# Patient Record
Sex: Female | Born: 1975 | Race: White | Hispanic: No | Marital: Married | Smoking: Former smoker
Health system: Southern US, Community
[De-identification: ages and names within clinical notes are randomized; demographics above are authoritative.]

## PROBLEM LIST (undated history)

## (undated) DIAGNOSIS — F32A Depression, unspecified: Secondary | ICD-10-CM

## (undated) DIAGNOSIS — I10 Essential (primary) hypertension: Secondary | ICD-10-CM

## (undated) DIAGNOSIS — K219 Gastro-esophageal reflux disease without esophagitis: Secondary | ICD-10-CM

## (undated) DIAGNOSIS — R519 Headache, unspecified: Secondary | ICD-10-CM

## (undated) DIAGNOSIS — E785 Hyperlipidemia, unspecified: Secondary | ICD-10-CM

## (undated) HISTORY — DX: Headache, unspecified: R51.9

## (undated) HISTORY — PX: ABDOMINAL HYSTERECTOMY: SHX81

## (undated) HISTORY — DX: Essential (primary) hypertension: I10

## (undated) HISTORY — PX: OTHER SURGICAL HISTORY: SHX169

## (undated) HISTORY — DX: Depression, unspecified: F32.A

## (undated) HISTORY — PX: APPENDECTOMY: SHX54

## (undated) HISTORY — DX: Hyperlipidemia, unspecified: E78.5

## (undated) HISTORY — PX: COLONOSCOPY: SHX174

## (undated) HISTORY — DX: Gastro-esophageal reflux disease without esophagitis: K21.9

---

## 2016-11-21 LAB — HM PAP SMEAR

## 2019-11-09 LAB — HM MAMMOGRAPHY: HM Mammogram: NORMAL (ref 0–4)

## 2019-11-09 LAB — HM PAP SMEAR

## 2020-03-28 ENCOUNTER — Other Ambulatory Visit: Payer: Self-pay

## 2020-03-28 ENCOUNTER — Ambulatory Visit: Payer: 59 | Admitting: Internal Medicine

## 2020-03-28 ENCOUNTER — Encounter: Payer: Self-pay | Admitting: Internal Medicine

## 2020-03-28 VITALS — BP 128/86 | HR 63 | Temp 98.2°F | Ht 62.0 in | Wt 126.0 lb

## 2020-03-28 DIAGNOSIS — T502X5A Adverse effect of carbonic-anhydrase inhibitors, benzothiadiazides and other diuretics, initial encounter: Secondary | ICD-10-CM | POA: Diagnosis not present

## 2020-03-28 DIAGNOSIS — Z23 Encounter for immunization: Secondary | ICD-10-CM

## 2020-03-28 DIAGNOSIS — E782 Mixed hyperlipidemia: Secondary | ICD-10-CM | POA: Insufficient documentation

## 2020-03-28 DIAGNOSIS — K625 Hemorrhage of anus and rectum: Secondary | ICD-10-CM | POA: Diagnosis not present

## 2020-03-28 DIAGNOSIS — I10 Essential (primary) hypertension: Secondary | ICD-10-CM | POA: Insufficient documentation

## 2020-03-28 DIAGNOSIS — Z0001 Encounter for general adult medical examination with abnormal findings: Secondary | ICD-10-CM | POA: Diagnosis not present

## 2020-03-28 DIAGNOSIS — E876 Hypokalemia: Secondary | ICD-10-CM

## 2020-03-28 DIAGNOSIS — Z1159 Encounter for screening for other viral diseases: Secondary | ICD-10-CM | POA: Insufficient documentation

## 2020-03-28 DIAGNOSIS — E785 Hyperlipidemia, unspecified: Secondary | ICD-10-CM | POA: Insufficient documentation

## 2020-03-28 DIAGNOSIS — Z0000 Encounter for general adult medical examination without abnormal findings: Secondary | ICD-10-CM | POA: Insufficient documentation

## 2020-03-28 DIAGNOSIS — K581 Irritable bowel syndrome with constipation: Secondary | ICD-10-CM | POA: Insufficient documentation

## 2020-03-28 LAB — CBC WITH DIFFERENTIAL/PLATELET
Basophils Absolute: 0.1 10*3/uL (ref 0.0–0.1)
Basophils Relative: 2.6 % (ref 0.0–3.0)
Eosinophils Absolute: 0.1 10*3/uL (ref 0.0–0.7)
Eosinophils Relative: 1.8 % (ref 0.0–5.0)
HCT: 42.1 % (ref 36.0–46.0)
Hemoglobin: 14.9 g/dL (ref 12.0–15.0)
Lymphocytes Relative: 36.6 % (ref 12.0–46.0)
Lymphs Abs: 1.9 10*3/uL (ref 0.7–4.0)
MCHC: 35.4 g/dL (ref 30.0–36.0)
MCV: 90.5 fl (ref 78.0–100.0)
Monocytes Absolute: 0.4 10*3/uL (ref 0.1–1.0)
Monocytes Relative: 6.9 % (ref 3.0–12.0)
Neutro Abs: 2.7 10*3/uL (ref 1.4–7.7)
Neutrophils Relative %: 52.1 % (ref 43.0–77.0)
Platelets: 240 10*3/uL (ref 150.0–400.0)
RBC: 4.65 Mil/uL (ref 3.87–5.11)
RDW: 12 % (ref 11.5–15.5)
WBC: 5.1 10*3/uL (ref 4.0–10.5)

## 2020-03-28 LAB — LIPID PANEL
Cholesterol: 207 mg/dL — ABNORMAL HIGH (ref 0–200)
HDL: 91.7 mg/dL (ref >39.00)
LDL Cholesterol: 98 mg/dL (ref 0–99)
NonHDL: 115.05
Total CHOL/HDL Ratio: 2
Triglycerides: 85 mg/dL (ref 0.0–149.0)
VLDL: 17 mg/dL (ref 0.0–40.0)

## 2020-03-28 LAB — HEPATIC FUNCTION PANEL
ALT: 11 U/L (ref 0–35)
AST: 17 U/L (ref 0–37)
Albumin: 4.7 g/dL (ref 3.5–5.2)
Alkaline Phosphatase: 37 U/L — ABNORMAL LOW (ref 39–117)
Bilirubin, Direct: 0.1 mg/dL (ref 0.0–0.3)
Total Bilirubin: 0.6 mg/dL (ref 0.2–1.2)
Total Protein: 6.9 g/dL (ref 6.0–8.3)

## 2020-03-28 LAB — BASIC METABOLIC PANEL
BUN: 7 mg/dL (ref 6–23)
CO2: 31 mEq/L (ref 19–32)
Calcium: 9.7 mg/dL (ref 8.4–10.5)
Chloride: 96 mEq/L (ref 96–112)
Creatinine, Ser: 0.59 mg/dL (ref 0.40–1.20)
GFR: 109.41 mL/min (ref >60.00)
Glucose, Bld: 95 mg/dL (ref 70–99)
Potassium: 3.3 mEq/L — ABNORMAL LOW (ref 3.5–5.1)
Sodium: 136 mEq/L (ref 135–145)

## 2020-03-28 LAB — MAGNESIUM: Magnesium: 1.7 mg/dL (ref 1.5–2.5)

## 2020-03-28 LAB — TSH: TSH: 1.14 u[IU]/mL (ref 0.35–4.50)

## 2020-03-28 LAB — SEDIMENTATION RATE: Sed Rate: 1 mm/hr (ref 0–20)

## 2020-03-28 MED ORDER — POTASSIUM CHLORIDE CRYS ER 20 MEQ PO TBCR: 20.0000 meq | EXTENDED_RELEASE_TABLET | Freq: Two times a day (BID) | ORAL | 0 refills | Status: DC

## 2020-03-28 MED ORDER — TRULANCE 3 MG PO TABS: 1.0000 | ORAL_TABLET | Freq: Every day | ORAL | 0 refills | Status: DC

## 2020-03-28 NOTE — Patient Instructions (Signed)

## 2020-03-28 NOTE — Progress Notes (Signed)
Subjective:  Patient ID: Katrina Bartlett, female    DOB: 11/04/1975  Age: 44 y.o. MRN: [id number]  CC: Abdominal Pain, Hypertension, Hyperlipidemia, and Annual Exam  This visit occurred during the SARS-CoV-2 public health emergency.  Safety protocols were in place, including screening questions prior to the visit, additional usage of staff PPE, and extensive cleaning of exam room while observing appropriate contact time as indicated for disinfecting solutions.   NEW TO ME  HPI Katrina Bartlett presents for a CPX.  She has had chronic, intermittent abdominal pain most of her entire life.  Her pain was so bad 8 years ago that she had a colonoscopy that she says was normal.  She tells me the pain has worsened over the last 6 months.  She describes intermittent episodes of bloating and cramping.  She tells me the symptoms get better after she uses a laxative.  She also uses fiber for the constipation.  She is also noted a few episodes of blood in her stool.  She has a history of hypertension and is taking the combination of a thiazide diuretic and losartan.  She denies dizziness, lightheadedness, chest pain, shortness of breath, dyspnea on exertion, palpitations, edema, or fatigue.  History Katrina Bartlett has a past medical history of Depression, Generalized headaches, GERD (gastroesophageal reflux disease), Hyperlipidemia, and Hypertension.   She has a past surgical history that includes Appendectomy and Abdominal hysterectomy.   Her family history includes Arthritis in her maternal grandfather, maternal grandmother, paternal grandfather, and paternal grandmother; Cancer in her maternal grandmother and paternal grandfather; Early death in her brother; Hearing loss in her father; Heart disease in her maternal grandfather; Hyperlipidemia in her father and maternal grandfather; Hypertension in her father and maternal grandfather; Kidney disease in her maternal grandfather.She reports that she has quit  smoking. She has never used smokeless tobacco. She reports current alcohol use of about 5.0 standard drinks of alcohol per week. She reports that she does not use drugs.  Outpatient Medications Prior to Visit  Medication Sig Dispense Refill  . estradiol (ESTRACE) 2 MG tablet Take 2 mg by mouth daily.    Marland Kitchen. losartan (COZAAR) 25 MG tablet Take 25 mg by mouth at bedtime.    . pravastatin (PRAVACHOL) 20 MG tablet Take 20 mg by mouth daily.    . SUMAtriptan (IMITREX) 100 MG tablet Take 100 mg by mouth at bedtime.    . hydrochlorothiazide (HYDRODIURIL) 25 MG tablet Take 50 mg by mouth daily.     No facility-administered medications prior to visit.    ROS Review of Systems  Constitutional: Negative for appetite change, diaphoresis, fatigue and unexpected weight change.  HENT: Negative.   Respiratory: Negative for cough, chest tightness, shortness of breath and wheezing.   Cardiovascular: Negative for chest pain, palpitations and leg swelling.  Gastrointestinal: Positive for abdominal pain, anal bleeding, blood in stool and constipation. Negative for abdominal distention, diarrhea, nausea, rectal pain and vomiting.  Genitourinary: Negative.  Negative for difficulty urinating and dysuria.  Musculoskeletal: Negative.  Negative for arthralgias and myalgias.  Skin: Negative.   Neurological: Negative.  Negative for dizziness, weakness and light-headedness.  Hematological: Negative for adenopathy. Does not bruise/bleed easily.  Psychiatric/Behavioral: Negative.     Objective:  BP 128/86   Pulse 63   Temp 98.2 F (36.8 C) (Oral)   Ht 5\' 2"  (1.575 m)   Wt 126 lb (57.2 kg)   SpO2 98%   BMI 23.05 kg/m   Physical Exam Vitals reviewed. Exam conducted with  a chaperone present Occupational hygienist).  Constitutional:      Appearance: She is well-developed.  HENT:     Nose: Nose normal.     Mouth/Throat:     Mouth: Mucous membranes are moist.  Eyes:     General: No scleral icterus.    Conjunctiva/sclera:  Conjunctivae normal.  Cardiovascular:     Rate and Rhythm: Normal rate and regular rhythm.     Heart sounds: No murmur heard.   Pulmonary:     Effort: Pulmonary effort is normal.     Breath sounds: No stridor. No wheezing, rhonchi or rales.  Abdominal:     General: Abdomen is flat. Bowel sounds are normal. There is no distension.     Palpations: Abdomen is soft. There is no hepatomegaly, splenomegaly or mass.     Tenderness: There is no abdominal tenderness.     Hernia: No hernia is present.  Genitourinary:    Exam position: Prone.     Rectum: Guaiac result negative. Internal hemorrhoid present. No mass, tenderness, anal fissure or external hemorrhoid. Normal anal tone.  Musculoskeletal:        General: Normal range of motion.     Cervical back: Neck supple.     Right lower leg: No edema.     Left lower leg: No edema.  Lymphadenopathy:     Cervical: No cervical adenopathy.  Skin:    General: Skin is warm and dry.     Coloration: Skin is not pale.  Neurological:     General: No focal deficit present.     Mental Status: She is alert.  Psychiatric:        Mood and Affect: Mood normal.        Behavior: Behavior normal.     Lab Results  Component Value Date   WBC 5.1 03/28/2020   HGB 14.9 03/28/2020   HCT 42.1 03/28/2020   PLT 240.0 03/28/2020   GLUCOSE 95 03/28/2020   CHOL 207 (H) 03/28/2020   TRIG 85.0 03/28/2020   HDL 91.70 03/28/2020   LDLCALC 98 03/28/2020   ALT 11 03/28/2020   AST 17 03/28/2020   NA 136 03/28/2020   K 3.3 (L) 03/28/2020   CL 96 03/28/2020   CREATININE 0.59 03/28/2020   BUN 7 03/28/2020   CO2 31 03/28/2020   TSH 1.14 03/28/2020    Assessment & Plan:   Marthella was seen today for abdominal pain, hypertension, hyperlipidemia and annual exam.  Diagnoses and all orders for this visit:  Primary hypertension- Her blood pressure is adequately well controlled but she has developed hypokalemia.  I recommended she change from a thiazide diuretic  to a potassium sparing diuretic and to start taking a potassium supplement.  Will continue the current dose of the ARB.  She agrees to return in 1 to 2 months for monitoring of her potassium level and blood pressure. -     CBC with Differential/Platelet; Future -     Basic metabolic panel; Future -     Magnesium; Future -     TSH; Future -     TSH -     Magnesium -     Basic metabolic panel -     CBC with Differential/Platelet -     potassium chloride SA (KLOR-CON) 20 MEQ tablet; Take 1 tablet (20 mEq total) by mouth 2 (two) times daily. -     spironolactone (ALDACTONE) 25 MG tablet; Take 1 tablet (25 mg total) by mouth daily.  Hyperlipidemia with  target LDL less than 130- She has achieved her LDL goal and is doing well on the statin. -     TSH; Future -     Hepatic function panel; Future -     Hepatic function panel -     TSH  Routine general medical examination at a health care facility- Exam completed, labs reviewed, vaccines reviewed and updated, cancer screenings are UTD, patient education was given. -     Lipid panel; Future -     Hepatitis C antibody; Future -     HIV Antibody (routine testing w rflx); Future -     HIV Antibody (routine testing w rflx) -     Hepatitis C antibody -     Lipid panel  Irritable bowel syndrome with constipation.- Her labs are negative for secondary causes of constipation.  I recommend that she treat this with plecanatide tied. -     Basic metabolic panel; Future -     Magnesium; Future -     Plecanatide (TRULANCE) 3 MG TABS; Take 1 tablet by mouth daily. -     Magnesium -     Basic metabolic panel  BRBPR (bright red blood per rectum)- There is no blood in her stool today and she does not have upper GI symptoms.  Her CBC and sed rate are normal.  The bleeding is likely related to internal hemorrhoids but I recommended that she undergo a colonoscopy to be certain there is not a lesion in the colon to explain the bleeding. -     Sedimentation rate;  Future -     Ambulatory referral to Gastroenterology -     Sedimentation rate  Need for hepatitis C screening test -     Hepatitis C antibody; Future -     Hepatitis C antibody  Diuretic-induced hypokalemia -     potassium chloride SA (KLOR-CON) 20 MEQ tablet; Take 1 tablet (20 mEq total) by mouth 2 (two) times daily. -     spironolactone (ALDACTONE) 25 MG tablet; Take 1 tablet (25 mg total) by mouth daily.  Other orders -     Flu Vaccine QUAD 6+ mos PF IM (Fluarix Quad PF) -     Tdap vaccine greater than or equal to 7yo IM   I have discontinued Katrina Manis Swarm's hydrochlorothiazide. I am also having her start on Trulance, potassium chloride SA, and spironolactone. Additionally, I am having her maintain her estradiol, losartan, pravastatin, and SUMAtriptan.  Meds ordered this encounter  Medications  . Plecanatide (TRULANCE) 3 MG TABS    Sig: Take 1 tablet by mouth daily.    Dispense:  24 tablet    Refill:  0  . potassium chloride SA (KLOR-CON) 20 MEQ tablet    Sig: Take 1 tablet (20 mEq total) by mouth 2 (two) times daily.    Dispense:  180 tablet    Refill:  0  . spironolactone (ALDACTONE) 25 MG tablet    Sig: Take 1 tablet (25 mg total) by mouth daily.    Dispense:  90 tablet    Refill:  0   In addition to time spent on CPE, I spent 50 minutes in preparing to see the patient by review of recent labs, imaging and procedures, obtaining and reviewing separately obtained history, communicating with the patient and family or caregiver, ordering medications, tests or procedures, and documenting clinical information in the EHR including the differential Dx, treatment, and any further evaluation and other management of 1. Primary hypertension  2. Hyperlipidemia with target LDL less than 130 3. Irritable bowel syndrome with constipation 4. BRBPR (bright red blood per rectum) 5. Diuretic-induced hypokalemia     Follow-up: Return in about 3 months (around 06/28/2020).  Sanda Linger,  MD

## 2020-03-29 ENCOUNTER — Encounter: Payer: Self-pay | Admitting: Internal Medicine

## 2020-03-29 LAB — HEPATITIS C ANTIBODY
Hepatitis C Ab: NONREACTIVE
SIGNAL TO CUT-OFF: 0.01 (ref <1.00)

## 2020-03-29 LAB — HIV ANTIBODY (ROUTINE TESTING W REFLEX): HIV 1&2 Ab, 4th Generation: NONREACTIVE

## 2020-03-29 MED ORDER — SPIRONOLACTONE 25 MG PO TABS: 25.0000 mg | ORAL_TABLET | Freq: Every day | ORAL | 0 refills | Status: DC

## 2020-04-08 ENCOUNTER — Ambulatory Visit: Payer: 59 | Admitting: Gastroenterology

## 2020-04-08 ENCOUNTER — Encounter: Payer: Self-pay | Admitting: Gastroenterology

## 2020-04-08 DIAGNOSIS — K625 Hemorrhage of anus and rectum: Secondary | ICD-10-CM | POA: Diagnosis not present

## 2020-04-08 DIAGNOSIS — R140 Abdominal distension (gaseous): Secondary | ICD-10-CM

## 2020-04-08 MED ORDER — SUPREP BOWEL PREP KIT 17.5-3.13-1.6 GM/177ML PO SOLN: 1.0000 | ORAL | 0 refills | Status: DC

## 2020-04-08 NOTE — Patient Instructions (Signed)
If you are age 44 or older, your body mass index should be between 23-30. Your Body mass index is 25.42 kg/m. If this is out of the aforementioned range listed, please consider follow up with your Primary Care Provider.  If you are age 19 or younger, your body mass index should be between 19-25. Your Body mass index is 25.42 kg/m. If this is out of the aformentioned range listed, please consider follow up with your Primary Care Provider.   You have been scheduled for a colonoscopy. Please follow written instructions given to you at your visit today.  Please pick up your prep supplies at the pharmacy within the next 1-3 days. If you use inhalers (even only as needed), please bring them with you on the day of your procedure.  Due to recent changes in healthcare laws, you may see the results of your imaging and laboratory studies on MyChart before your provider has had a chance to review them.  We understand that in some cases there may be results that are confusing or concerning to you. Not all laboratory results come back in the same time frame and the provider may be waiting for multiple results in order to interpret others.  Please give [place] 48 hours in order for your provider to thoroughly review all the results before contacting the office for clarification of your results.   Please start taking citrucel (orange flavored) powder fiber supplement.  This may cause some bloating at first but that usually goes away. Begin with a small spoonful and work your way up to a large, heaping spoonful daily over a week.  Thank you for entrusting me with your care and choosing [facility].  Dr Christella Hartigan

## 2020-04-08 NOTE — Progress Notes (Signed)
HPI: This is a very pleasant 44 year old woman   who was referred to me by Etta Grandchild, MD  to evaluate minor rectal bleeding, chronic bloating.    She has had discomforts and troubles with her bowels for 10 to 15 years.  These have gradually been getting worse.  She describes incomplete evacuation, extreme bloating.  She will have to wear loose pants because of the bloating and the pains that her pants were because.  She had a colonoscopy 10 to 12 years ago in [place].  She was told she had scar tissue from when her appendix was removed and that surgery might help but also might make things worse.  She is not really constipated but does have a sensation of incomplete evacuation usually.  She has a lot of bloating and that seems to be her biggest complaint.  Her weight is overall stable.  She takes probiotics twice daily and this seems to help.  Only she takes it twice daily.  She tried fiber supplements a long time ago and was not very consistent with it.  She has tried aloe vera juice and that did seem to help but just minorly.  She has seen red blood in her stool twice in the past 2 or 3 months  Colon cancer does not run in her family but her mother does the bowel diverticulosis  Old Data Reviewed:  Blood work November 2021 normal CBC, normal complete metabolic profile, normal TSH   Review of systems: Pertinent positive and negative review of systems were noted in the above HPI section. All other review negative.   Past Medical History:  Diagnosis Date   Depression    Generalized headaches    GERD (gastroesophageal reflux disease)    Hyperlipidemia    Hypertension     Past Surgical History:  Procedure Laterality Date   ABDOMINAL HYSTERECTOMY     partial   APPENDECTOMY     COLONOSCOPY      Current Outpatient Medications  Medication Sig Dispense Refill   estradiol (ESTRACE) 2 MG tablet Take 2 mg by mouth daily.     losartan (COZAAR) 25 MG tablet Take 25  mg by mouth at bedtime.     potassium chloride SA (KLOR-CON) 20 MEQ tablet Take 1 tablet (20 mEq total) by mouth 2 (two) times daily. 180 tablet 0   pravastatin (PRAVACHOL) 20 MG tablet Take 20 mg by mouth daily.     spironolactone (ALDACTONE) 25 MG tablet Take 1 tablet (25 mg total) by mouth daily. 90 tablet 0   SUMAtriptan (IMITREX) 100 MG tablet Take 100 mg by mouth at bedtime.     No current facility-administered medications for this visit.    Allergies as of 04/08/2020 - Review Complete 04/08/2020  Allergen Reaction Noted   Penicillins Hives 03/28/2020    Family History  Problem Relation Age of Onset   Hearing loss Father    Hyperlipidemia Father    Hypertension Father    Early death Brother        SUICIDE   Arthritis Maternal Grandmother    Cancer Maternal Grandmother    Arthritis Maternal Grandfather    Heart disease Maternal Grandfather    Hyperlipidemia Maternal Grandfather    Hypertension Maternal Grandfather    Kidney disease Maternal Grandfather    Arthritis Paternal Grandmother    Arthritis Paternal Grandfather    Cancer Paternal Grandfather    Colon cancer Neg Hx    Pancreatic cancer Neg Hx  Esophageal cancer Neg Hx     Social History   Socioeconomic History   Marital status: Married    Spouse name: Not on file   Number of children: Not on file   Years of education: Not on file   Highest education level: Not on file  Occupational History   Not on file  Tobacco Use   Smoking status: Former Smoker   Smokeless tobacco: Never Used  Vaping Use   Vaping Use: Never used  Substance and Sexual Activity   Alcohol use: Yes    Alcohol/week: 5.0 standard drinks    Types: 5 Glasses of wine per week   Drug use: Never   Sexual activity: Yes    Partners: Male    Birth control/protection: Surgical  Other Topics Concern   Not on file  Social History Narrative   Not on file   Social Determinants of Health   Financial  Resource Strain:    Difficulty of Paying Living Expenses: Not on file  Food Insecurity:    Worried About Programme researcher, broadcasting/film/video in the Last Year: Not on file   [employer] of Food in the Last Year: Not on file  Transportation Needs:    Lack of Transportation (Medical): Not on file   Lack of Transportation (Non-Medical): Not on file  Physical Activity:    Days of Exercise per Week: Not on file   Minutes of Exercise per Session: Not on file  Stress:    Feeling of Stress : Not on file  Social Connections:    Frequency of Communication with Friends and Family: Not on file   Frequency of Social Gatherings with Friends and Family: Not on file   Attends Religious Services: Not on file   Active Member of Clubs or Organizations: Not on file   Attends Banker Meetings: Not on file   Marital Status: Not on file  Intimate Partner Violence:    Fear of Current or Ex-Partner: Not on file   Emotionally Abused: Not on file   Physically Abused: Not on file   Sexually Abused: Not on file     Physical Exam: Ht 5\' 2"  (1.575 m)    Wt 139 lb (63 kg)    BMI 25.42 kg/m  Constitutional: generally well-appearing Psychiatric: alert and oriented x3 Eyes: extraocular movements intact Mouth: oral pharynx moist, no lesions Neck: supple no lymphadenopathy Cardiovascular: heart regular rate and rhythm Lungs: clear to auscultation bilaterally Abdomen: soft, nontender, nondistended, no obvious ascites, no peritoneal signs, normal bowel sounds Extremities: no lower extremity edema bilaterally Skin: no lesions on visible extremities   Assessment and plan: 44 y.o. female with chronic bloating, chronic incomplete evacuation  Suspicious for functional disease, perhaps irritable bowel syndrome.  She has seen some blood in her stool recently recommended colonoscopy.  I would also like to look at the area of her appendix to get an idea of what she was told previously about scar tissue.   That might have been a discussion about adhesive disease but perhaps she had a complex appendectomy which required ileocolonic anastomosis and she has stricturing at that site.  She does not recall ever being told that she had severe necrotic type appendectomy however.  In the meantime she is going to slowly titrate upward on Citrucel powder fiber supplement.  She understands that at first this might make her bloating worse but over time will hopefully help her sensation of incomplete evacuation and then maybe hopefully also it improve her  bloating.  Please see the "Patient Instructions" section for addition details about the plan.   Rob Bunting, MD [facility] 04/08/2020, 1:29 PM  Cc: Etta Grandchild, MD  Total time on date of encounter was (this included time spent preparing to see the patient reviewing records; obtaining and/or reviewing separately obtained history; performing a medically appropriate exam and/or evaluation; counseling and educating the patient and family if present; ordering medications, tests or procedures if applicable; and documenting clinical information in the health record).

## 2020-04-16 ENCOUNTER — Encounter: Payer: Self-pay | Admitting: Internal Medicine

## 2020-04-18 ENCOUNTER — Other Ambulatory Visit: Payer: Self-pay | Admitting: Internal Medicine

## 2020-04-18 DIAGNOSIS — I10 Essential (primary) hypertension: Secondary | ICD-10-CM

## 2020-04-18 DIAGNOSIS — T502X5A Adverse effect of carbonic-anhydrase inhibitors, benzothiadiazides and other diuretics, initial encounter: Secondary | ICD-10-CM

## 2020-04-18 MED ORDER — LOSARTAN POTASSIUM 100 MG PO TABS: 100.0000 mg | ORAL_TABLET | Freq: Every day | ORAL | 0 refills | Status: DC

## 2020-04-18 MED ORDER — SPIRONOLACTONE 50 MG PO TABS: 50.0000 mg | ORAL_TABLET | Freq: Every day | ORAL | 0 refills | Status: DC

## 2020-04-25 ENCOUNTER — Telehealth: Payer: Self-pay | Admitting: Internal Medicine

## 2020-04-25 NOTE — Telephone Encounter (Signed)
Spoke to pt, informed her per 11/8 lab result note PCP wanted her to return in about 2-50mo for a recheck. She has an OV schedule for February for a 85mo follow up and she will have it checked at that time.

## 2020-04-25 NOTE — Telephone Encounter (Signed)
    Patient wants to know if she needs to have potasium checked next week  Please advise/order

## 2020-06-02 ENCOUNTER — Encounter: Payer: Self-pay | Admitting: Internal Medicine

## 2020-06-02 ENCOUNTER — Other Ambulatory Visit: Payer: Self-pay

## 2020-06-02 ENCOUNTER — Ambulatory Visit: Payer: 59 | Admitting: Internal Medicine

## 2020-06-02 VITALS — BP 144/92 | HR 67 | Temp 97.9°F | Resp 16 | Ht 62.0 in | Wt 128.0 lb

## 2020-06-02 DIAGNOSIS — E876 Hypokalemia: Secondary | ICD-10-CM

## 2020-06-02 DIAGNOSIS — G43009 Migraine without aura, not intractable, without status migrainosus: Secondary | ICD-10-CM | POA: Diagnosis not present

## 2020-06-02 DIAGNOSIS — T502X5A Adverse effect of carbonic-anhydrase inhibitors, benzothiadiazides and other diuretics, initial encounter: Secondary | ICD-10-CM | POA: Diagnosis not present

## 2020-06-02 DIAGNOSIS — I10 Essential (primary) hypertension: Secondary | ICD-10-CM

## 2020-06-02 LAB — BASIC METABOLIC PANEL
BUN: 9 mg/dL (ref 6–23)
CO2: 30 mEq/L (ref 19–32)
Calcium: 9.7 mg/dL (ref 8.4–10.5)
Chloride: 101 mEq/L (ref 96–112)
Creatinine, Ser: 0.63 mg/dL (ref 0.40–1.20)
GFR: 107.56 mL/min (ref >60.00)
Glucose, Bld: 87 mg/dL (ref 70–99)
Potassium: 3.7 mEq/L (ref 3.5–5.1)
Sodium: 136 mEq/L (ref 135–145)

## 2020-06-02 LAB — MAGNESIUM: Magnesium: 1.6 mg/dL (ref 1.5–2.5)

## 2020-06-02 MED ORDER — NURTEC 75 MG PO TBDP: 1.0000 | ORAL_TABLET | Freq: Every day | ORAL | 0 refills | Status: DC | PRN

## 2020-06-02 MED ORDER — TORSEMIDE 10 MG PO TABS: 10.0000 mg | ORAL_TABLET | Freq: Every day | ORAL | 1 refills | Status: DC

## 2020-06-02 NOTE — Patient Instructions (Signed)

## 2020-06-02 NOTE — Progress Notes (Signed)
Subjective:  Patient ID: Katrina Bartlett, female    DOB: 06-Jul-1975  Age: 45 y.o. MRN: [id number]  CC: Hypertension  This visit occurred during the SARS-CoV-2 public health emergency.  Safety protocols were in place, including screening questions prior to the visit, additional usage of staff PPE, and extensive cleaning of exam room while observing appropriate contact time as indicated for disinfecting solutions.    HPI Katrina Bartlett presents for f/up - She complains that she intermittently has spikes in her blood pressure and swelling in her hands and feet.  She occasionally takes a triptan for headaches.  She denies chest pain, shortness of breath, palpitations, or muscle aches.  Outpatient Medications Prior to Visit  Medication Sig Dispense Refill  . estradiol (ESTRACE) 2 MG tablet Take 2 mg by mouth daily.    Marland Kitchen losartan (COZAAR) 100 MG tablet Take 1 tablet (100 mg total) by mouth daily. 90 tablet 0  . Na Sulfate-K Sulfate-Mg Sulf (SUPREP BOWEL PREP KIT) 17.5-3.13-1.6 GM/177ML SOLN Take 1 kit by mouth as directed. 324 mL 0  . potassium chloride SA (KLOR-CON) 20 MEQ tablet Take 1 tablet (20 mEq total) by mouth 2 (two) times daily. 180 tablet 0  . pravastatin (PRAVACHOL) 20 MG tablet Take 20 mg by mouth daily.    Marland Kitchen spironolactone (ALDACTONE) 50 MG tablet Take 1 tablet (50 mg total) by mouth daily. 90 tablet 0  . SUMAtriptan (IMITREX) 100 MG tablet Take 100 mg by mouth at bedtime.     No facility-administered medications prior to visit.    ROS Review of Systems  Constitutional: Negative for appetite change, diaphoresis and fatigue.  Eyes: Negative for visual disturbance.  Respiratory: Negative for cough, chest tightness, shortness of breath and wheezing.   Cardiovascular: Negative for chest pain, palpitations and leg swelling.  Gastrointestinal: Negative for abdominal pain, constipation, diarrhea, nausea and vomiting.  Endocrine: Negative.   Genitourinary: Negative.  Negative for  difficulty urinating.  Musculoskeletal: Negative.  Negative for myalgias.  Skin: Negative.  Negative for color change, pallor and rash.  Neurological: Positive for headaches. Negative for dizziness, weakness and light-headedness.  Hematological: Negative for adenopathy. Does not bruise/bleed easily.  Psychiatric/Behavioral: Negative.     Objective:  BP (!) 144/92   Pulse 67   Temp 97.9 F (36.6 C) (Oral)   Resp 16   Ht '5\' 2"'  (1.575 m)   Wt 128 lb (58.1 kg)   SpO2 97%   BMI 23.41 kg/m   BP Readings from Last 3 Encounters:  06/02/20 (!) 144/92  04/08/20 128/80  03/28/20 128/86    Wt Readings from Last 3 Encounters:  06/02/20 128 lb (58.1 kg)  04/08/20 139 lb (63 kg)  03/28/20 126 lb (57.2 kg)    Physical Exam Vitals reviewed.  Constitutional:      Appearance: Normal appearance.  HENT:     Nose: Nose normal.     Mouth/Throat:     Mouth: Mucous membranes are moist.  Eyes:     General: No scleral icterus.    Extraocular Movements: Extraocular movements intact.     Conjunctiva/sclera: Conjunctivae normal.     Pupils: Pupils are equal, round, and reactive to light.  Cardiovascular:     Rate and Rhythm: Normal rate and regular rhythm.     Heart sounds: No murmur heard.   Pulmonary:     Effort: Pulmonary effort is normal.     Breath sounds: No stridor. No wheezing, rhonchi or rales.  Abdominal:     General:  Abdomen is flat.     Palpations: There is no mass.     Tenderness: There is no abdominal tenderness. There is no guarding.  Musculoskeletal:        General: Normal range of motion.     Cervical back: Neck supple.     Right lower leg: No edema.     Left lower leg: No edema.  Lymphadenopathy:     Cervical: No cervical adenopathy.  Skin:    General: Skin is warm and dry.     Coloration: Skin is not pale.  Neurological:     General: No focal deficit present.     Mental Status: She is alert.     Lab Results  Component Value Date   WBC 5.1 03/28/2020    HGB 14.9 03/28/2020   HCT 42.1 03/28/2020   PLT 240.0 03/28/2020   GLUCOSE 95 03/28/2020   CHOL 207 (H) 03/28/2020   TRIG 85.0 03/28/2020   HDL 91.70 03/28/2020   LDLCALC 98 03/28/2020   ALT 11 03/28/2020   AST 17 03/28/2020   NA 136 03/28/2020   K 3.3 (L) 03/28/2020   CL 96 03/28/2020   CREATININE 0.59 03/28/2020   BUN 7 03/28/2020   CO2 31 03/28/2020   TSH 1.14 03/28/2020    Patient was never admitted.  Assessment & Plan:   Katrina Bartlett was seen today for hypertension.  Diagnoses and all orders for this visit:  Diuretic-induced hypokalemia- Her potassium level is normal now.  Will continue the current dose of spironolactone and the potassium supplement. -     Basic metabolic panel; Future -     Cancel: Magnesium; Future -     Magnesium; Future -     Magnesium -     Basic metabolic panel  Primary hypertension- Her blood pressure is not adequately well controlled and she intermittently complains of peripheral edema.  Will start a loop diuretic. -     Basic metabolic panel; Future -     Cancel: Magnesium; Future -     Magnesium; Future -     Magnesium -     Basic metabolic panel -     torsemide (DEMADEX) 10 MG tablet; Take 1 tablet (10 mg total) by mouth daily.  Migraine without aura and without status migrainosus, not intractable- I am concerned the triptan may be elevating her blood pressure so Irecommended that she switch to a CGRP antagonist. -     Rimegepant Sulfate (NURTEC) 75 MG TBDP; Take 1 tablet by mouth daily as needed.   I am having Katrina Bartlett start on Nurtec and torsemide. I am also having her maintain her estradiol, pravastatin, SUMAtriptan, potassium chloride SA, Suprep Bowel Prep Kit, losartan, and spironolactone.  Meds ordered this encounter  Medications  . Rimegepant Sulfate (NURTEC) 75 MG TBDP    Sig: Take 1 tablet by mouth daily as needed.    Dispense:  8 tablet    Refill:  0  . torsemide (DEMADEX) 10 MG tablet    Sig: Take 1 tablet (10 mg  total) by mouth daily.    Dispense:  90 tablet    Refill:  1     Follow-up: No follow-ups on file.  Katrina Calico, MD

## 2020-06-08 ENCOUNTER — Telehealth: Payer: Self-pay | Admitting: Gastroenterology

## 2020-06-08 ENCOUNTER — Encounter: Payer: Self-pay | Admitting: Internal Medicine

## 2020-06-08 NOTE — Telephone Encounter (Signed)
Good morning Dr. Christella Hartigan, patient called and stated husband contracted covid so she rescheduled her procedure from 06/13/2020 to 07/12/2020.

## 2020-06-09 MED ORDER — PRAVASTATIN SODIUM 20 MG PO TABS: 20.0000 mg | ORAL_TABLET | Freq: Every day | ORAL | 3 refills | Status: DC

## 2020-06-13 ENCOUNTER — Encounter: Payer: 59 | Admitting: Gastroenterology

## 2020-06-22 ENCOUNTER — Other Ambulatory Visit: Payer: Self-pay | Admitting: Internal Medicine

## 2020-06-22 DIAGNOSIS — T502X5A Adverse effect of carbonic-anhydrase inhibitors, benzothiadiazides and other diuretics, initial encounter: Secondary | ICD-10-CM

## 2020-06-22 DIAGNOSIS — I10 Essential (primary) hypertension: Secondary | ICD-10-CM

## 2020-06-22 DIAGNOSIS — E876 Hypokalemia: Secondary | ICD-10-CM

## 2020-06-22 MED ORDER — POTASSIUM CHLORIDE CRYS ER 20 MEQ PO TBCR
20.0000 meq | EXTENDED_RELEASE_TABLET | Freq: Two times a day (BID) | ORAL | 0 refills | Status: DC
Start: 2020-06-22 — End: 2020-09-22

## 2020-07-01 ENCOUNTER — Encounter: Payer: Self-pay | Admitting: Gastroenterology

## 2020-07-04 ENCOUNTER — Other Ambulatory Visit: Payer: Self-pay | Admitting: Internal Medicine

## 2020-07-04 DIAGNOSIS — G43009 Migraine without aura, not intractable, without status migrainosus: Secondary | ICD-10-CM

## 2020-07-05 ENCOUNTER — Other Ambulatory Visit: Payer: Self-pay | Admitting: Internal Medicine

## 2020-07-05 ENCOUNTER — Encounter: Payer: Self-pay | Admitting: Internal Medicine

## 2020-07-05 DIAGNOSIS — G43009 Migraine without aura, not intractable, without status migrainosus: Secondary | ICD-10-CM

## 2020-07-05 MED ORDER — NURTEC 75 MG PO TBDP: 1.0000 | ORAL_TABLET | Freq: Every day | ORAL | 0 refills | Status: DC | PRN

## 2020-07-06 ENCOUNTER — Encounter: Payer: Self-pay | Admitting: Internal Medicine

## 2020-07-06 DIAGNOSIS — G43009 Migraine without aura, not intractable, without status migrainosus: Secondary | ICD-10-CM

## 2020-07-06 MED ORDER — NURTEC 75 MG PO TBDP: 1.0000 | ORAL_TABLET | Freq: Every day | ORAL | 0 refills | Status: DC | PRN

## 2020-07-07 ENCOUNTER — Encounter: Payer: Self-pay | Admitting: Internal Medicine

## 2020-07-07 NOTE — Telephone Encounter (Signed)
Patient called and said that [facility] did not get the prescription. She said that their system has been down the last few times she has been there and was wondering if it could be called in.    Rimegepant Sulfate (NURTEC) 75 MG TBDP  [id number] - [place], [place] - [address] AT [facility] [address]

## 2020-07-11 ENCOUNTER — Encounter: Payer: Self-pay | Admitting: Internal Medicine

## 2020-07-12 ENCOUNTER — Encounter: Payer: Self-pay | Admitting: Gastroenterology

## 2020-07-12 ENCOUNTER — Ambulatory Visit (AMBULATORY_SURGERY_CENTER): Payer: 59 | Admitting: Gastroenterology

## 2020-07-12 ENCOUNTER — Other Ambulatory Visit: Payer: Self-pay

## 2020-07-12 VITALS — BP 123/63 | HR 55 | Temp 97.3°F | Resp 15 | Ht 62.0 in | Wt 139.0 lb

## 2020-07-12 DIAGNOSIS — R194 Change in bowel habit: Secondary | ICD-10-CM

## 2020-07-12 DIAGNOSIS — K625 Hemorrhage of anus and rectum: Secondary | ICD-10-CM | POA: Diagnosis present

## 2020-07-12 MED ORDER — SODIUM CHLORIDE 0.9 % IV SOLN
500.0000 mL | Freq: Once | INTRAVENOUS | Status: DC
Start: 2020-07-12 — End: 2020-07-12

## 2020-07-12 NOTE — Progress Notes (Signed)
Vs by CW in adm 

## 2020-07-12 NOTE — Progress Notes (Signed)
Called to room to assist during endoscopic procedure.  Patient ID and intended procedure confirmed with present staff. Received instructions for my participation in the procedure from the performing physician.  

## 2020-07-12 NOTE — Patient Instructions (Signed)
YOU HAD AN ENDOSCOPIC PROCEDURE TODAY AT THE [facility]:   Refer to the procedure report that was given to you for any specific questions about what was found during the examination.  If the procedure report does not answer your questions, please call your gastroenterologist to clarify.  If you requested that your care partner not be given the details of your procedure findings, then the procedure report has been included in a sealed envelope for you to review at your convenience later.  YOU SHOULD EXPECT: Some feelings of bloating in the abdomen. Passage of more gas than usual.  Walking can help get rid of the air that was put into your GI tract during the procedure and reduce the bloating. If you had a lower endoscopy (such as a colonoscopy or flexible sigmoidoscopy) you may notice spotting of blood in your stool or on the toilet paper. If you underwent a bowel prep for your procedure, you may not have a normal bowel movement for a few days.  Please Note:  You might notice some irritation and congestion in your nose or some drainage.  This is from the oxygen used during your procedure.  There is no need for concern and it should clear up in a day or so.  SYMPTOMS TO REPORT IMMEDIATELY:   Following lower endoscopy (colonoscopy or flexible sigmoidoscopy):  Excessive amounts of blood in the stool  Significant tenderness or worsening of abdominal pains  Swelling of the abdomen that is new, acute  Fever of 100F or higher  For urgent or emergent issues, a gastroenterologist can be reached at any hour by calling [phone]. Do not use MyChart messaging for urgent concerns.    DIET:  We do recommend a small meal at first, but then you may proceed to your regular diet.  Drink plenty of fluids but you should avoid alcoholic beverages for 24 hours.  ACTIVITY:  You should plan to take it easy for the rest of today and you should NOT DRIVE or use heavy machinery until tomorrow (because  of the sedation medicines used during the test).    FOLLOW UP: Our staff will call the number listed on your records 48-72 hours following your procedure to check on you and address any questions or concerns that you may have regarding the information given to you following your procedure. If we do not reach you, we will leave a message.  We will attempt to reach you two times.  During this call, we will ask if you have developed any symptoms of COVID 19. If you develop any symptoms (ie: fever, flu-like symptoms, shortness of breath, cough etc.) before then, please call [phone].  If you test positive for Covid 19 in the 2 weeks post procedure, please call and report this information to us.    If any biopsies were taken you will be contacted by phone or by letter within the next 1-3 weeks.  Please call us at [phone] if you have not heard about the biopsies in 3 weeks.    SIGNATURES/CONFIDENTIALITY: You and/or your care partner have signed paperwork which will be entered into your electronic medical record.  These signatures attest to the fact that that the information above on your After Visit Summary has been reviewed and is understood.  Full responsibility of the confidentiality of this discharge information lies with you and/or your care-partner. 

## 2020-07-12 NOTE — Op Note (Signed)
[facility] Patient Name: Katrina Bartlett Procedure Date: 07/12/2020 2:11 PM MRN: [id number] Endoscopist: Rachael Fee , MD Age: 45 Referring MD:  Date of Birth: 1976/03/21 Gender: Female Account #: [id number] Procedure:                Colonoscopy Indications:              Change in bowel habits, bloating, h/o remote                            appendectomy, minor rectal bleeding Medicines:                Monitored Anesthesia Care Procedure:                Pre-Anesthesia Assessment:                           - Prior to the procedure, a History and Physical                            was performed, and patient medications and                            allergies were reviewed. The patient's tolerance of                            previous anesthesia was also reviewed. The risks                            and benefits of the procedure and the sedation                            options and risks were discussed with the patient.                            All questions were answered, and informed consent                            was obtained. Prior Anticoagulants: The patient has                            taken no previous anticoagulant or antiplatelet                            agents. ASA Grade Assessment: II - A patient with                            mild systemic disease. After reviewing the risks                            and benefits, the patient was deemed in                            satisfactory condition to undergo the procedure.  After obtaining informed consent, the colonoscope                            was passed under direct vision. Throughout the                            procedure, the patient's blood pressure, pulse, and                            oxygen saturations were monitored continuously. The                            Olympus CF-[id number]) Colonoscope was                            introduced through the anus and  advanced to the the                            terminal ileum. The colonoscopy was performed                            without difficulty. The patient tolerated the                            procedure well. The quality of the bowel                            preparation was excellent. Scope In: 2:20:14 PM Scope Out: 2:31:49 PM Scope Withdrawal Time: 0 hours 6 minutes 29 seconds  Total Procedure Duration: 0 hours 11 minutes 35 seconds  Findings:                 The terminal ileum appeared normal.                           Biopsies for histology were taken with a cold                            forceps from the entire colon for evaluation of                            microscopic colitis.                           The exam was otherwise without abnormality on                            direct and retroflexion views. Complications:            No immediate complications. Estimated blood loss:                            None. Estimated Blood Loss:     Estimated blood loss was minimal. Impression:               - The examined portion of the ileum was normal.                           -  The examination was otherwise normal on direct                            and retroflexion views.                           - Biopsies were taken with a cold forceps from the                            entire colon for evaluation of microscopic colitis.                           - You previous minor rectal bleeding was almost                            certainly from small hemorrhoids that have since                            resolved. Recommendation:           - Patient has a contact number available for                            emergencies. The signs and symptoms of potential                            delayed complications were discussed with the                            patient. Return to normal activities tomorrow.                            Written discharge instructions were provided to the                             patient.                           - Resume previous diet.                           - Continue present medications.                           - Await pathology results. Rachael Fee, MD 07/12/2020 2:42:59 PM This report has been signed electronically.

## 2020-07-12 NOTE — Progress Notes (Signed)
Report to PACU, RN, vss, BBS= Clear.  

## 2020-07-13 ENCOUNTER — Other Ambulatory Visit: Payer: Self-pay | Admitting: Internal Medicine

## 2020-07-13 DIAGNOSIS — E876 Hypokalemia: Secondary | ICD-10-CM

## 2020-07-13 DIAGNOSIS — I10 Essential (primary) hypertension: Secondary | ICD-10-CM

## 2020-07-13 DIAGNOSIS — T502X5A Adverse effect of carbonic-anhydrase inhibitors, benzothiadiazides and other diuretics, initial encounter: Secondary | ICD-10-CM

## 2020-07-13 MED ORDER — LOSARTAN POTASSIUM 100 MG PO TABS: 100.0000 mg | ORAL_TABLET | Freq: Every day | ORAL | 2 refills | Status: DC

## 2020-07-13 NOTE — Telephone Encounter (Signed)
Key: [id number]

## 2020-07-13 NOTE — Telephone Encounter (Signed)
  [facility] denial [facility] calling with  additional clinical questions Will medication be used in combination [place]? Has patient seen a specialist?  Please call [phone] Ref #[id number]

## 2020-07-14 ENCOUNTER — Ambulatory Visit: Payer: 59 | Admitting: Internal Medicine

## 2020-07-14 ENCOUNTER — Telehealth: Payer: Self-pay | Admitting: *Deleted

## 2020-07-14 MED ORDER — SPIRONOLACTONE 50 MG PO TABS: 50.0000 mg | ORAL_TABLET | Freq: Every day | ORAL | 0 refills | Status: DC

## 2020-07-14 NOTE — Telephone Encounter (Signed)
  Follow up Call-  Call back number 07/12/2020  Post procedure Call Back phone  # [phone]  Permission to leave phone message Yes     Patient questions:  Do you have a fever, pain , or abdominal swelling? No. Pain Score  0 *  Have you tolerated food without any problems? Yes.    Have you been able to return to your normal activities? Yes.    Do you have any questions about your discharge instructions: Diet   No. Medications  No. Follow up visit  No.  Do you have questions or concerns about your Care? No.  Actions: * If pain score is 4 or above: 1. No action needed, pain <4.Have you developed a fever since your procedure? no  2.   Have you had an respiratory symptoms (SOB or cough) since your procedure? no  3.   Have you tested positive for COVID 19 since your procedure no  4.   Have you had any family members/close contacts diagnosed with the COVID 19 since your procedure?  no   If yes to any of these questions please route to Laverna Peace, RN and Karlton Lemon, RN

## 2020-07-15 NOTE — Telephone Encounter (Signed)
The correct number for [facility] is [phone]  I have sat in queue for without speaking to a rep.  Will attempt to call again on Monday morning.

## 2020-07-19 ENCOUNTER — Encounter: Payer: Self-pay | Admitting: Internal Medicine

## 2020-07-19 ENCOUNTER — Other Ambulatory Visit: Payer: Self-pay | Admitting: Internal Medicine

## 2020-07-19 DIAGNOSIS — G43009 Migraine without aura, not intractable, without status migrainosus: Secondary | ICD-10-CM

## 2020-07-19 MED ORDER — SUMATRIPTAN SUCCINATE 100 MG PO TABS: 100.0000 mg | ORAL_TABLET | ORAL | 3 refills | Status: DC | PRN

## 2020-07-19 NOTE — Telephone Encounter (Signed)
Closing out. PCP changed course of treatment.

## 2020-07-21 ENCOUNTER — Encounter: Payer: Self-pay | Admitting: Gastroenterology

## 2020-07-21 ENCOUNTER — Encounter: Payer: Self-pay | Admitting: Internal Medicine

## 2020-07-25 NOTE — Telephone Encounter (Signed)
[facility] calling to request additional information for Nurtec  New ref key [facility] [phone]

## 2020-08-11 ENCOUNTER — Ambulatory Visit: Payer: 59 | Admitting: Internal Medicine

## 2020-09-22 ENCOUNTER — Other Ambulatory Visit: Payer: Self-pay | Admitting: Internal Medicine

## 2020-09-22 DIAGNOSIS — E876 Hypokalemia: Secondary | ICD-10-CM

## 2020-09-22 DIAGNOSIS — I10 Essential (primary) hypertension: Secondary | ICD-10-CM

## 2020-10-03 ENCOUNTER — Other Ambulatory Visit: Payer: Self-pay | Admitting: Internal Medicine

## 2020-10-03 DIAGNOSIS — I10 Essential (primary) hypertension: Secondary | ICD-10-CM

## 2020-10-03 DIAGNOSIS — E876 Hypokalemia: Secondary | ICD-10-CM

## 2020-10-03 DIAGNOSIS — T502X5A Adverse effect of carbonic-anhydrase inhibitors, benzothiadiazides and other diuretics, initial encounter: Secondary | ICD-10-CM

## 2020-10-11 ENCOUNTER — Other Ambulatory Visit: Payer: Self-pay | Admitting: Internal Medicine

## 2020-10-11 DIAGNOSIS — I10 Essential (primary) hypertension: Secondary | ICD-10-CM

## 2020-12-20 ENCOUNTER — Other Ambulatory Visit: Payer: Self-pay | Admitting: Internal Medicine

## 2020-12-20 ENCOUNTER — Encounter: Payer: Self-pay | Admitting: Internal Medicine

## 2020-12-20 DIAGNOSIS — E876 Hypokalemia: Secondary | ICD-10-CM

## 2020-12-20 DIAGNOSIS — I10 Essential (primary) hypertension: Secondary | ICD-10-CM

## 2020-12-20 DIAGNOSIS — G43009 Migraine without aura, not intractable, without status migrainosus: Secondary | ICD-10-CM

## 2020-12-20 MED ORDER — SUMATRIPTAN SUCCINATE 100 MG PO TABS: 100.0000 mg | ORAL_TABLET | ORAL | 3 refills | Status: DC | PRN

## 2020-12-20 MED ORDER — TORSEMIDE 10 MG PO TABS: 10.0000 mg | ORAL_TABLET | Freq: Every day | ORAL | 0 refills | Status: DC

## 2020-12-26 ENCOUNTER — Other Ambulatory Visit: Payer: Self-pay | Admitting: Internal Medicine

## 2020-12-26 DIAGNOSIS — E876 Hypokalemia: Secondary | ICD-10-CM

## 2020-12-26 DIAGNOSIS — I10 Essential (primary) hypertension: Secondary | ICD-10-CM

## 2020-12-26 MED ORDER — SPIRONOLACTONE 50 MG PO TABS: 50.0000 mg | ORAL_TABLET | Freq: Every day | ORAL | 0 refills | Status: DC

## 2021-01-10 ENCOUNTER — Ambulatory Visit (INDEPENDENT_AMBULATORY_CARE_PROVIDER_SITE_OTHER): Payer: 59

## 2021-01-10 ENCOUNTER — Encounter: Payer: Self-pay | Admitting: Internal Medicine

## 2021-01-10 ENCOUNTER — Other Ambulatory Visit: Payer: Self-pay

## 2021-01-10 ENCOUNTER — Ambulatory Visit (INDEPENDENT_AMBULATORY_CARE_PROVIDER_SITE_OTHER): Payer: 59 | Admitting: Internal Medicine

## 2021-01-10 VITALS — BP 136/78 | HR 64 | Temp 97.8°F | Resp 16 | Ht 62.0 in | Wt 129.6 lb

## 2021-01-10 DIAGNOSIS — R911 Solitary pulmonary nodule: Secondary | ICD-10-CM

## 2021-01-10 DIAGNOSIS — T502X5A Adverse effect of carbonic-anhydrase inhibitors, benzothiadiazides and other diuretics, initial encounter: Secondary | ICD-10-CM | POA: Diagnosis not present

## 2021-01-10 DIAGNOSIS — Z1231 Encounter for screening mammogram for malignant neoplasm of breast: Secondary | ICD-10-CM

## 2021-01-10 DIAGNOSIS — Z0000 Encounter for general adult medical examination without abnormal findings: Secondary | ICD-10-CM | POA: Diagnosis not present

## 2021-01-10 DIAGNOSIS — E876 Hypokalemia: Secondary | ICD-10-CM | POA: Diagnosis not present

## 2021-01-10 DIAGNOSIS — I10 Essential (primary) hypertension: Secondary | ICD-10-CM | POA: Diagnosis not present

## 2021-01-10 DIAGNOSIS — G43009 Migraine without aura, not intractable, without status migrainosus: Secondary | ICD-10-CM

## 2021-01-10 LAB — CBC WITH DIFFERENTIAL/PLATELET
Basophils Absolute: 0.1 10*3/uL (ref 0.0–0.1)
Basophils Relative: 1.2 % (ref 0.0–3.0)
Eosinophils Absolute: 0.1 10*3/uL (ref 0.0–0.7)
Eosinophils Relative: 1.8 % (ref 0.0–5.0)
HCT: 39.2 % (ref 36.0–46.0)
Hemoglobin: 13.7 g/dL (ref 12.0–15.0)
Lymphocytes Relative: 26.1 % (ref 12.0–46.0)
Lymphs Abs: 1.4 10*3/uL (ref 0.7–4.0)
MCHC: 34.9 g/dL (ref 30.0–36.0)
MCV: 91.7 fl (ref 78.0–100.0)
Monocytes Absolute: 0.4 10*3/uL (ref 0.1–1.0)
Monocytes Relative: 7.9 % (ref 3.0–12.0)
Neutro Abs: 3.3 10*3/uL (ref 1.4–7.7)
Neutrophils Relative %: 63 % (ref 43.0–77.0)
Platelets: 223 10*3/uL (ref 150.0–400.0)
RBC: 4.27 Mil/uL (ref 3.87–5.11)
RDW: 12.3 % (ref 11.5–15.5)
WBC: 5.3 10*3/uL (ref 4.0–10.5)

## 2021-01-10 LAB — BASIC METABOLIC PANEL
BUN: 11 mg/dL (ref 6–23)
CO2: 28 mEq/L (ref 19–32)
Calcium: 9.3 mg/dL (ref 8.4–10.5)
Chloride: 99 mEq/L (ref 96–112)
Creatinine, Ser: 0.66 mg/dL (ref 0.40–1.20)
GFR: 105.9 mL/min (ref >60.00)
Glucose, Bld: 92 mg/dL (ref 70–99)
Potassium: 3.9 mEq/L (ref 3.5–5.1)
Sodium: 134 mEq/L — ABNORMAL LOW (ref 135–145)

## 2021-01-10 LAB — LIPID PANEL
Cholesterol: 180 mg/dL (ref 0–200)
HDL: 64.1 mg/dL (ref >39.00)
LDL Cholesterol: 104 mg/dL — ABNORMAL HIGH (ref 0–99)
NonHDL: 115.84
Total CHOL/HDL Ratio: 3
Triglycerides: 60 mg/dL (ref 0.0–149.0)
VLDL: 12 mg/dL (ref 0.0–40.0)

## 2021-01-10 LAB — MAGNESIUM: Magnesium: 1.8 mg/dL (ref 1.5–2.5)

## 2021-01-10 MED ORDER — EMGALITY 120 MG/ML ~~LOC~~ SOAJ: 2.2400 mL | Freq: Once | SUBCUTANEOUS | 0 refills | Status: AC

## 2021-01-10 NOTE — Patient Instructions (Signed)
Health Maintenance, Female Adopting a healthy lifestyle and getting preventive care are important in promoting health and wellness. Ask your health care provider about: The right schedule for you to have regular tests and exams. Things you can do on your own to prevent diseases and keep yourself healthy. What should I know about diet, weight, and exercise? Eat a healthy diet  Eat a diet that includes plenty of vegetables, fruits, low-fat dairy products, and lean protein. Do not eat a lot of foods that are high in solid fats, added sugars, or sodium.  Maintain a healthy weight Body mass index (BMI) is used to identify weight problems. It estimates body fat based on height and weight. Your health care provider can help determineyour BMI and help you achieve or maintain a healthy weight. Get regular exercise Get regular exercise. This is one of the most important things you can do for your health. Most adults should: Exercise for at least 150 minutes each week. The exercise should increase your heart rate and make you sweat (moderate-intensity exercise). Do strengthening exercises at least twice a week. This is in addition to the moderate-intensity exercise. Spend less time sitting. Even light physical activity can be beneficial. Watch cholesterol and blood lipids Have your blood tested for lipids and cholesterol at 45 years of age, then havethis test every 5 years. Have your cholesterol levels checked more often if: Your lipid or cholesterol levels are high. You are older than 45 years of age. You are at high risk for heart disease. What should I know about cancer screening? Depending on your health history and family history, you may need to have cancer screening at various ages. This may include screening for: Breast cancer. Cervical cancer. Colorectal cancer. Skin cancer. Lung cancer. What should I know about heart disease, diabetes, and high blood pressure? Blood pressure and heart  disease High blood pressure causes heart disease and increases the risk of stroke. This is more likely to develop in people who have high blood pressure readings, are of African descent, or are overweight. Have your blood pressure checked: Every 3-5 years if you are 18-39 years of age. Every year if you are 40 years old or older. Diabetes Have regular diabetes screenings. This checks your fasting blood sugar level. Have the screening done: Once every three years after age 40 if you are at a normal weight and have a low risk for diabetes. More often and at a younger age if you are overweight or have a high risk for diabetes. What should I know about preventing infection? Hepatitis B If you have a higher risk for hepatitis B, you should be screened for this virus. Talk with your health care provider to find out if you are at risk forhepatitis B infection. Hepatitis C Testing is recommended for: Everyone born from 1945 through 1965. Anyone with known risk factors for hepatitis C. Sexually transmitted infections (STIs) Get screened for STIs, including gonorrhea and chlamydia, if: You are sexually active and are younger than 45 years of age. You are older than 45 years of age and your health care provider tells you that you are at risk for this type of infection. Your sexual activity has changed since you were last screened, and you are at increased risk for chlamydia or gonorrhea. Ask your health care provider if you are at risk. Ask your health care provider about whether you are at high risk for HIV. Your health care provider may recommend a prescription medicine to help   prevent HIV infection. If you choose to take medicine to prevent HIV, you should first get tested for HIV. You should then be tested every 3 months for as long as you are taking the medicine. Pregnancy If you are about to stop having your period (premenopausal) and you may become pregnant, seek counseling before you get  pregnant. Take 400 to 800 micrograms (mcg) of folic acid every day if you become pregnant. Ask for birth control (contraception) if you want to prevent pregnancy. Osteoporosis and menopause Osteoporosis is a disease in which the bones lose minerals and strength with aging. This can result in bone fractures. If you are 65 years old or older, or if you are at risk for osteoporosis and fractures, ask your health care provider if you should: Be screened for bone loss. Take a calcium or vitamin D supplement to lower your risk of fractures. Be given hormone replacement therapy (HRT) to treat symptoms of menopause. Follow these instructions at home: Lifestyle Do not use any products that contain nicotine or tobacco, such as cigarettes, e-cigarettes, and chewing tobacco. If you need help quitting, ask your health care provider. Do not use street drugs. Do not share needles. Ask your health care provider for help if you need support or information about quitting drugs. Alcohol use Do not drink alcohol if: Your health care provider tells you not to drink. You are pregnant, may be pregnant, or are planning to become pregnant. If you drink alcohol: Limit how much you use to 0-1 drink a day. Limit intake if you are breastfeeding. Be aware of how much alcohol is in your drink. In the [place] one drink equals one 12 oz bottle of beer (355 mL), one 5 oz glass of wine (148 mL), or one 1 oz glass of hard liquor (44 mL). General instructions Schedule regular health, dental, and eye exams. Stay current with your vaccines. Tell your health care provider if: You often feel depressed. You have ever been abused or do not feel safe at home. Summary Adopting a healthy lifestyle and getting preventive care are important in promoting health and wellness. Follow your health care provider's instructions about healthy diet, exercising, and getting tested or screened for diseases. Follow your health care provider's  instructions on monitoring your cholesterol and blood pressure. This information is not intended to replace advice given to you by your health care provider. Make sure you discuss any questions you have with your healthcare provider. Document Revised: 04/30/2018 Document Reviewed: 04/30/2018 Elsevier Patient Education  2022 [employer]  

## 2021-01-10 NOTE — Progress Notes (Signed)
Subjective:  Patient ID: Katrina Bartlett, female    DOB: [birth year]  Age: 45 y.o. MRN: [id number]  CC: Annual Exam  This visit occurred during the SARS-CoV-2 public health emergency.  Safety protocols were in place, including screening questions prior to the visit, additional usage of staff PPE, and extensive cleaning of exam room while observing appropriate contact time as indicated for disinfecting solutions.    HPI Katrina Bartlett presents for a CPX and f/up -   She has a chronic nonproductive cough.  She denies chest pain, shortness of breath, wheezing, fever, chills, or night sweats.  She underwent a CT scan about 5 months ago and a nodule was discovered in her right lung.  She complains of a 1 month history of increased frequency of migraine headaches.  She also recently had a migraine that lasted 3 days.  She is getting modest symptom relief with the triptan.  She says the headaches are not worse or different just more frequent.  Outpatient Medications Prior to Visit  Medication Sig Dispense Refill   losartan (COZAAR) 100 MG tablet Take 1 tablet (100 mg total) by mouth daily. 90 tablet 2   potassium chloride SA (KLOR-CON) 20 MEQ tablet TAKE 1 TABLET(20 MEQ) BY MOUTH TWICE DAILY 180 tablet 0   pravastatin (PRAVACHOL) 20 MG tablet Take 1 tablet (20 mg total) by mouth daily. 90 tablet 3   spironolactone (ALDACTONE) 50 MG tablet Take 1 tablet (50 mg total) by mouth daily. 90 tablet 0   SUMAtriptan (IMITREX) 100 MG tablet Take 1 tablet (100 mg total) by mouth every 2 (two) hours as needed for migraine. 10 tablet 3   torsemide (DEMADEX) 10 MG tablet Take 1 tablet (10 mg total) by mouth daily. 90 tablet 0   estradiol (ESTRACE) 2 MG tablet Take 2 mg by mouth daily.     No facility-administered medications prior to visit.    ROS Review of Systems  Constitutional:  Negative for diaphoresis, fatigue and unexpected weight change.  HENT: Negative.    Eyes: Negative.   Respiratory:   Positive for cough. Negative for chest tightness, shortness of breath and wheezing.   Cardiovascular:  Negative for chest pain, palpitations and leg swelling.  Gastrointestinal:  Negative for abdominal pain, constipation, diarrhea, nausea and vomiting.  Endocrine: Negative.   Genitourinary: Negative.  Negative for difficulty urinating.  Musculoskeletal: Negative.  Negative for back pain and myalgias.  Skin: Negative.   Neurological:  Positive for headaches. Negative for dizziness, seizures, speech difficulty, weakness, light-headedness and numbness.  Hematological:  Negative for adenopathy. Does not bruise/bleed easily.  Psychiatric/Behavioral: Negative.     Objective:  BP 136/78 (BP Location: Right Arm, Patient Position: Sitting, Cuff Size: Large)   Pulse 64   Temp 97.8 F (36.6 C) (Oral)   Resp 16   Ht 5\' 2"  (1.575 m)   Wt 129 lb 9.6 oz (58.8 kg)   SpO2 99%   BMI 23.70 kg/m   BP Readings from Last 3 Encounters:  01/10/21 136/78  07/12/20 123/63  06/02/20 (!) 144/92    Wt Readings from Last 3 Encounters:  01/10/21 129 lb 9.6 oz (58.8 kg)  07/12/20 139 lb (63 kg)  06/02/20 128 lb (58.1 kg)    Physical Exam Vitals reviewed.  Constitutional:      Appearance: Normal appearance. She is not ill-appearing.  HENT:     Nose: Nose normal.     Mouth/Throat:     Mouth: Mucous membranes are moist.  Eyes:  Conjunctiva/sclera: Conjunctivae normal.  Cardiovascular:     Rate and Rhythm: Normal rate and regular rhythm.     Heart sounds: No murmur heard. Pulmonary:     Effort: Pulmonary effort is normal.     Breath sounds: No stridor. No wheezing, rhonchi or rales.  Abdominal:     General: Abdomen is flat.     Palpations: There is no mass.     Tenderness: There is no abdominal tenderness. There is no guarding.     Hernia: No hernia is present.  Musculoskeletal:        General: Normal range of motion.     Cervical back: Neck supple.     Right lower leg: No edema.     Left  lower leg: No edema.  Lymphadenopathy:     Cervical: No cervical adenopathy.  Skin:    General: Skin is warm and dry.  Neurological:     General: No focal deficit present.     Mental Status: She is alert and oriented to person, place, and time. Mental status is at baseline.     Motor: No weakness.     Gait: Gait normal.     Deep Tendon Reflexes: Reflexes normal.  Psychiatric:        Mood and Affect: Mood normal.        Behavior: Behavior normal.    Lab Results  Component Value Date   WBC 5.3 01/10/2021   HGB 13.7 01/10/2021   HCT 39.2 01/10/2021   PLT 223.0 01/10/2021   GLUCOSE 92 01/10/2021   CHOL 180 01/10/2021   TRIG 60.0 01/10/2021   HDL 64.10 01/10/2021   LDLCALC 104 (H) 01/10/2021   ALT 11 03/28/2020   AST 17 03/28/2020   NA 134 (L) 01/10/2021   K 3.9 01/10/2021   CL 99 01/10/2021   CREATININE 0.66 01/10/2021   BUN 11 01/10/2021   CO2 28 01/10/2021   TSH 1.14 03/28/2020    FINDINGS:   Mild bronchiectasis and atelectasis is noted in the right middle lobe and lingula. 4 mm nodule in the lingula on series 6, image 9.   The liver, gallbladder, spleen, pancreas, and adrenal glands are unremarkable.   There are no radiopaque urinary tract stones or hydronephrosis. The bladder is unremarkable.   Small bowel is nondilated. The colon is unremarkable. The appendix is absent. There is no free air or free fluid. No bulky adenopathy. There is mild aortoiliac atherosclerotic disease.   No acute osseous abnormality is seen. No acute osseous abnormality is seen.      IMPRESSION:  1.  No evidence of radiopaque urinary tract stone or hydronephrosis.   2.   4 mm nodule in the lingula. Mild bronchiectasis and atelectasis is noted in the right middle lobe and lingula, nonspecific but may represent sequela of chronic or atypical mycobacterial infection. Dedicated chest CT may be helpful for further evaluation.      Electronically Signed by: Katrina Bartlett on 07/20/2020 12:35  PM Exam End: 07/20/20 12:18   Specimen Collected: 07/20/20 12:29 Last Resulted: 07/20/20 12:35  Received From: [facility]  Result Received: 09/15/20 09:05   Vie    Assessment & Plan:   Katrina Bartlett was seen today for annual exam.  Diagnoses and all orders for this visit:  Primary hypertension- Her blood pressure is adequately well controlled.  I will check labs to screen for secondary causes and endorgan damage.  Will continue the current antihypertensives. -     CBC with Differential/Platelet; Future -  Basic metabolic panel; Future -     Magnesium; Future -     Aldosterone + renin activity w/ ratio; Future -     Aldosterone + renin activity w/ ratio -     Magnesium -     Basic metabolic panel -     CBC with Differential/Platelet  Routine general medical examination at a health care facility- Exam completed, labs reviewed, vaccines are up-to-date, cancer screenings addressed, patient education was given. -     Lipid panel; Future -     Lipid panel  Diuretic-induced hypokalemia- Her K+ level is normal. -     Basic metabolic panel; Future -     Magnesium; Future -     Aldosterone + renin activity w/ ratio; Future -     Aldosterone + renin activity w/ ratio -     Magnesium -     Basic metabolic panel  Nodule of middle lobe of right lung- Will recheck a CT scan to monitor the nodule. -     DG Chest 2 View; Future -     CT Chest Wo Contrast; Future  Visit for screening mammogram -     MM DIGITAL SCREENING BILATERAL; Future  Migraine without aura and without status migrainosus, not intractable- I recommended that she prevent and reduce migraines with a CGRP antagonist. -     Galcanezumab-gnlm (EMGALITY) 120 MG/ML SOAJ; Inject 2.24 mLs into the skin once for 1 dose.  I have discontinued Lanora Manis Perlow's estradiol. I am also having her start on Emgality. Additionally, I am having her maintain her pravastatin, losartan, potassium chloride SA, SUMAtriptan, torsemide, and  spironolactone.  Meds ordered this encounter  Medications   Galcanezumab-gnlm (EMGALITY) 120 MG/ML SOAJ    Sig: Inject 2.24 mLs into the skin once for 1 dose.    Dispense:  2.24 mL    Refill:  0     Follow-up: Return in about 3 months (around 04/12/2021).  Sanda Linger, MD

## 2021-01-11 ENCOUNTER — Encounter: Payer: Self-pay | Admitting: Internal Medicine

## 2021-01-15 ENCOUNTER — Other Ambulatory Visit: Payer: Self-pay | Admitting: Internal Medicine

## 2021-01-15 DIAGNOSIS — I10 Essential (primary) hypertension: Secondary | ICD-10-CM

## 2021-01-15 DIAGNOSIS — G43009 Migraine without aura, not intractable, without status migrainosus: Secondary | ICD-10-CM

## 2021-01-15 DIAGNOSIS — T502X5A Adverse effect of carbonic-anhydrase inhibitors, benzothiadiazides and other diuretics, initial encounter: Secondary | ICD-10-CM

## 2021-01-15 DIAGNOSIS — E876 Hypokalemia: Secondary | ICD-10-CM

## 2021-01-18 LAB — ALDOSTERONE + RENIN ACTIVITY W/ RATIO
ALDO / PRA Ratio: 1.8 Ratio (ref 0.9–28.9)
Aldosterone: 19 ng/dL
Renin Activity: 10.82 ng/mL/h — ABNORMAL HIGH (ref 0.25–5.82)

## 2021-01-19 ENCOUNTER — Other Ambulatory Visit: Payer: Self-pay | Admitting: Internal Medicine

## 2021-01-19 ENCOUNTER — Encounter: Payer: Self-pay | Admitting: Internal Medicine

## 2021-01-20 ENCOUNTER — Other Ambulatory Visit: Payer: Self-pay

## 2021-01-20 ENCOUNTER — Ambulatory Visit (INDEPENDENT_AMBULATORY_CARE_PROVIDER_SITE_OTHER)
Admission: RE | Admit: 2021-01-20 | Discharge: 2021-01-20 | Disposition: A | Discharge status: Home / Self Care | Payer: 59 | Source: Ambulatory Visit | Attending: Internal Medicine | Admitting: Internal Medicine

## 2021-01-20 DIAGNOSIS — R911 Solitary pulmonary nodule: Secondary | ICD-10-CM

## 2021-01-24 ENCOUNTER — Encounter: Payer: Self-pay | Admitting: Internal Medicine

## 2021-01-29 ENCOUNTER — Other Ambulatory Visit: Payer: Self-pay | Admitting: Internal Medicine

## 2021-01-29 DIAGNOSIS — I25118 Atherosclerotic heart disease of native coronary artery with other forms of angina pectoris: Secondary | ICD-10-CM | POA: Insufficient documentation

## 2021-01-29 DIAGNOSIS — I2510 Atherosclerotic heart disease of native coronary artery without angina pectoris: Secondary | ICD-10-CM | POA: Insufficient documentation

## 2021-02-10 ENCOUNTER — Encounter: Payer: Self-pay | Admitting: Cardiology

## 2021-02-10 ENCOUNTER — Ambulatory Visit: Payer: 59 | Admitting: Cardiology

## 2021-02-10 ENCOUNTER — Other Ambulatory Visit: Payer: Self-pay

## 2021-02-10 VITALS — BP 124/79 | HR 83 | Temp 97.8°F | Resp 16 | Ht 62.0 in | Wt 129.0 lb

## 2021-02-10 DIAGNOSIS — I739 Peripheral vascular disease, unspecified: Secondary | ICD-10-CM

## 2021-02-10 DIAGNOSIS — I25118 Atherosclerotic heart disease of native coronary artery with other forms of angina pectoris: Secondary | ICD-10-CM

## 2021-02-10 DIAGNOSIS — R072 Precordial pain: Secondary | ICD-10-CM | POA: Insufficient documentation

## 2021-02-10 DIAGNOSIS — I10 Essential (primary) hypertension: Secondary | ICD-10-CM

## 2021-02-10 MED ORDER — ROSUVASTATIN CALCIUM 10 MG PO TABS: 10.0000 mg | ORAL_TABLET | Freq: Every day | ORAL | 3 refills | Status: DC

## 2021-02-10 MED ORDER — METOPROLOL TARTRATE 50 MG PO TABS: 50.0000 mg | ORAL_TABLET | ORAL | 1 refills | Status: DC

## 2021-02-10 NOTE — Progress Notes (Addendum)
Patient referred by Janith Lima, MD for coronary calcification  Subjective:   Katrina Bartlett, female    DOB: 1976-05-01, 45 y.o.   MRN: [id number]   Chief Complaint  Patient presents with   Coronary Artery Disease   Hypertension   New Patient (Initial Visit)     HPI  45 year old female with hypertension, former smoker, family history of early CAD, referred for admission clinical history patient seen on CT scan.    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  However, patient is very active, runs regularly.  She ran 7 miles this morning and 1 hour 10 minutes.  She is currently training for half marathon.  She runs two half marathons every year.  Her last half marathon was in February 2022 in [place] where she clocked just under 10-minute a mile.  On further specific questioning, she does report some exertional dyspnea.  Her running speed used to be about 9-minute a mile 3 years ago.  During early days of COVID, she did not run any half marathon. She has noticed gradual decline in her speed, now about 10-minute mile.  On specific questioning, she reports some "leg fatigue" while running, more than before.  She does report right-sided chest pain that does sometimes occur during running, and resolves with rest.  That said, she has had similar chest pains for several years ever since she lost her brother about 20 years ago (not due to cardiac cause).   Patient used to smoke about half to 1 pack a day, smoked for close to 12 years.  She has now been quit for 14 years.  Patient's mother had hyperlipidemia and hypertension.  Her maternal grandfather had a massive MI at age 70.    Recently, she underwent chest x-ray and CT scan due to complaints of chronic cough.  In addition to some pulmonary nodules, this showed advanced aorta and coronary artery calcification for her age.  She was thus referred to me.   Past Medical History:  Diagnosis Date   Depression     Generalized headaches    GERD (gastroesophageal reflux disease)    Hyperlipidemia    Hypertension      Past Surgical History:  Procedure Laterality Date   ABDOMINAL HYSTERECTOMY     partial   APPENDECTOMY     COLONOSCOPY     right hand surgery       Social History   Tobacco Use  Smoking Status Former  Smokeless Tobacco Never    Social History   Substance and Sexual Activity  Alcohol Use Yes   Alcohol/week: 5.0 standard drinks   Types: 5 Glasses of wine per week     Family History  Problem Relation Age of Onset   Hearing loss Father    Hyperlipidemia Father    Hypertension Father    Early death Brother        SUICIDE   Arthritis Maternal Grandmother    Cancer Maternal Grandmother    Arthritis Maternal Grandfather    Heart disease Maternal Grandfather    Hyperlipidemia Maternal Grandfather    Hypertension Maternal Grandfather    Kidney disease Maternal Grandfather    Arthritis Paternal Grandmother    Arthritis Paternal Grandfather    Cancer Paternal Grandfather    Colon cancer Neg Hx    Pancreatic cancer Neg Hx    Esophageal cancer Neg Hx      Current Outpatient Medications on File Prior to  Visit  Medication Sig Dispense Refill   losartan (COZAAR) 100 MG tablet TAKE 1 TABLET(100 MG) BY MOUTH DAILY 90 tablet 1   potassium chloride SA (KLOR-CON) 20 MEQ tablet TAKE 1 TABLET(20 MEQ) BY MOUTH TWICE DAILY 180 tablet 1   pravastatin (PRAVACHOL) 20 MG tablet Take 1 tablet (20 mg total) by mouth daily. 90 tablet 3   spironolactone (ALDACTONE) 50 MG tablet Take 1 tablet (50 mg total) by mouth daily. 90 tablet 0   SUMAtriptan (IMITREX) 100 MG tablet TAKE 1 TABLET BY MOUTH EVERY 2 HOURS AS NEEDED FOR MIGRAINE 10 tablet 3   torsemide (DEMADEX) 10 MG tablet Take 1 tablet (10 mg total) by mouth daily. 90 tablet 0   No current facility-administered medications on file prior to visit.    Cardiovascular and other pertinent studies:  EKG 02/10/2021: Sinus rhythm 70  bpm  Normal EKG  CT chest 01/20/2021: Scattered atherosclerotic calcifications including advanced coronary arterial calcification for age.   Volume loss/atelectasis in RIGHT middle lobe with mild bronchiectasis and scattered mucous plugging, likely post inflammatory.   Scattered BILATERAL pulmonary nodules up to 5 mm diameter; no follow-up needed if patient is low-risk (and has no known or suspected primary neoplasm). Non-contrast chest CT can be considered in 12 months if patient is high-risk. This recommendation follows the consensus statement: Guidelines for Management of Incidental Pulmonary Nodules Detected on CT Images: From the Fleischner Society 2017; Radiology 2017; 284:228-243.    Recent labs: 01/10/2021: Glucose 92, BUN/Cr 11/0.66. EGFR 105. Na/K 134/3.9. Rest of the CMP normal H/H 13/39. MCV 91. Platelets 223 HbA1C N/A Chol 180, TG 60, HDL 64, LDL 104 TSH N/A Creatinine/aldosterone ratio normal.   Renin activity 10 (<5.8)   Review of Systems  Cardiovascular:  Positive for chest pain, claudication and dyspnea on exertion. Negative for leg swelling, palpitations and syncope.        Vitals:   02/10/21 0902  BP: 124/79  Pulse: 83  Resp: 16  Temp: 97.8 F (36.6 C)  SpO2: 99%     Body mass index is 23.59 kg/m. Filed Weights   02/10/21 0902  Weight: 129 lb (58.5 kg)     Objective:   Physical Exam Vitals and nursing note reviewed.  Constitutional:      General: She is not in acute distress. Neck:     Vascular: No JVD.  Cardiovascular:     Rate and Rhythm: Normal rate and regular rhythm.     Pulses:          Dorsalis pedis pulses are 0 on the right side and 2+ on the left side.       Posterior tibial pulses are 2+ on the right side and 2+ on the left side.     Heart sounds: Normal heart sounds. No murmur heard. Pulmonary:     Effort: Pulmonary effort is normal.     Breath sounds: Normal breath sounds. No wheezing or rales.  Musculoskeletal:      Right lower leg: No edema.     Left lower leg: No edema.        Assessment & Recommendations:    45 year old female with hypertension, former smoker, family history of early CAD, referred for coronary calcification seen on CT scan.    Coronary calcification/coronary artery disease: Surprisingly, advanced calcification seen on aorta and coronaries.  Patient is very active at baseline and an avid runner.  However, she does have risk factors of hypertension, hyperlipidemia, former smoking history, and family history  of premature CAD with MI in maternal grandfather at age 74.  She does have some exertional dyspnea and somewhat typical chest pain symptoms.  I personally reviewed her CT chest that shows calcification, primarily in mid LAD. Given her findings and symptoms relatively engaged, I would like to obtain detailed anatomical evaluation of her coronary vasculature.  Therefore, have ordered CT coronary angiogram for further evaluation.  Metoprolol prescribed for heart rate control during CT scan. I will also check echocardiogram. Recommend switching pravastatin to rosuvastatin 10 mg daily. I will check lipoprotein a. Will add aspirin 81 mg daily, should she have any obstructive coronary artery disease.  Hypertension: Controlled  Hyperlipidemia: As above  Claudication: I cannot feel right DP pulse.  While this could very well be anatomical variation, given her coronary calcification, I will obtain lower extremity duplex to rule out any PAD as well.    Thank you for referring the patient to [place]. Please feel free to contact with any questions.   Nigel Mormon, MD Pager: [phone] Office: [phone]

## 2021-02-10 NOTE — Patient Instructions (Addendum)
Your cardiac CT will be scheduled at the locations below:   [facility]  [address]  [place], [place] [address]  [phone]    If scheduled at [facility], please arrive at the [facility] main entrance of [facility] 30-45 minutes prior to test start time.  Proceed to the [facility] Radiology Department (first floor) to check-in and test prep.   Please follow these instructions carefully (unless otherwise directed):   On the Night Before the Test:   Be sure to Drink plenty of water.   Do not consume any caffeinated/decaffeinated beverages or chocolate 12 hours prior to your test.   Do not take any antihistamines 12 hours prior to your test.   If the patient has contrast allergy:  Patient will need a prescription for Prednisone and very clear instructions (as follows):  1. Prednisone 50 mg - take 13 hours prior to test  2. Take another Prednisone 50 mg 7 hours prior to test  3. Take another Prednisone 50 mg 1 hour prior to test  4. Take Benadryl 50 mg 1 hour prior to test   Patient must complete all four doses of above prophylactic medications.   Patient will need a ride after test due to Benadryl.   On the Day of the Test:   Drink plenty of water. Do not drink any water within one hour of the test.   Do not eat any food 4 hours prior to the test.   You may take your regular medications prior to the test.    - Metoprolol tartarate 50 mg Take 1 pill twice a day starting 3 days before CT scan Take morning dose 2 hour before CT scan You may stop it thereafter   FEMALES- please wear underwire-free bra if available          After the Test:   Drink plenty of water.   After receiving IV contrast, you may experience a mild flushed feeling. This is normal.   On occasion, you may experience a mild rash up to 24 hours after the test. This is not dangerous. If this occurs, you can take Benadryl 25 mg and increase your fluid intake.    If you experience trouble breathing, this can be serious. If it is severe call 911 IMMEDIATELY. If it is mild, please call our office.   If you take any of these medications: Glipizide/Metformin, Avandament, Glucavance, please do not take 48 hours after completing test unless otherwise instructed.     Please contact the cardiac imaging nurse navigator should you have any questions/concerns  Rockwell Alexandria, RN Navigator Cardiac Imaging  [facility]  [phone] Office  [phone] Cell

## 2021-02-16 ENCOUNTER — Other Ambulatory Visit: Payer: Self-pay

## 2021-02-16 DIAGNOSIS — I25118 Atherosclerotic heart disease of native coronary artery with other forms of angina pectoris: Secondary | ICD-10-CM

## 2021-02-16 MED ORDER — METOPROLOL TARTRATE 50 MG PO TABS: 50.0000 mg | ORAL_TABLET | ORAL | 1 refills | Status: DC

## 2021-02-20 ENCOUNTER — Telehealth (HOSPITAL_COMMUNITY): Payer: Self-pay | Admitting: *Deleted

## 2021-02-20 NOTE — Telephone Encounter (Signed)
Reaching out to patient to offer assistance regarding upcoming cardiac imaging study; pt verbalizes understanding of appt date/time, parking situation and where to check in, pre-test NPO status and medications ordered, and verified current allergies; name and call back number provided for further questions should they arise  Larey Brick RN Navigator Cardiac Imaging [facility] [phone] office [phone] cell  Patient to take 50mg  metoprolol tartrate prior to cardiac CT scan.

## 2021-02-21 LAB — BASIC METABOLIC PANEL
BUN/Creatinine Ratio: 18 (ref 9–23)
BUN: 12 mg/dL (ref 6–24)
CO2: 23 mmol/L (ref 20–29)
Calcium: 9.2 mg/dL (ref 8.7–10.2)
Chloride: 102 mmol/L (ref 96–106)
Creatinine, Ser: 0.68 mg/dL (ref 0.57–1.00)
Glucose: 84 mg/dL (ref 70–99)
Potassium: 4.5 mmol/L (ref 3.5–5.2)
Sodium: 138 mmol/L (ref 134–144)
eGFR: 109 mL/min/{1.73_m2} (ref >59)

## 2021-02-21 LAB — LIPOPROTEIN A (LPA): Lipoprotein (a): 186.6 nmol/L — ABNORMAL HIGH (ref <75.0)

## 2021-02-22 ENCOUNTER — Encounter (HOSPITAL_COMMUNITY): Payer: Self-pay

## 2021-02-22 ENCOUNTER — Other Ambulatory Visit: Payer: Self-pay

## 2021-02-22 ENCOUNTER — Ambulatory Visit (HOSPITAL_COMMUNITY)
Admission: RE | Admit: 2021-02-22 | Discharge: 2021-02-22 | Disposition: A | Discharge status: Home / Self Care | Payer: 59 | Source: Ambulatory Visit | Attending: Cardiology | Admitting: Cardiology

## 2021-02-22 DIAGNOSIS — R072 Precordial pain: Secondary | ICD-10-CM | POA: Insufficient documentation

## 2021-02-22 DIAGNOSIS — I25118 Atherosclerotic heart disease of native coronary artery with other forms of angina pectoris: Secondary | ICD-10-CM | POA: Insufficient documentation

## 2021-02-22 MED ORDER — IOHEXOL 350 MG/ML SOLN
95.0000 mL | Freq: Once | INTRAVENOUS | Status: AC | PRN
  Administered 2021-02-22: 95 mL via INTRAVENOUS

## 2021-02-22 MED ORDER — NITROGLYCERIN 0.4 MG SL SUBL
SUBLINGUAL_TABLET | SUBLINGUAL | Status: AC
  Filled 2021-02-22: qty 2

## 2021-02-22 MED ORDER — NITROGLYCERIN 0.4 MG SL SUBL
0.8000 mg | SUBLINGUAL_TABLET | Freq: Once | SUBLINGUAL | Status: AC
  Administered 2021-02-22: 0.8 mg via SUBLINGUAL

## 2021-02-28 ENCOUNTER — Other Ambulatory Visit: Payer: Self-pay

## 2021-02-28 ENCOUNTER — Ambulatory Visit: Payer: 59

## 2021-02-28 DIAGNOSIS — I25118 Atherosclerotic heart disease of native coronary artery with other forms of angina pectoris: Secondary | ICD-10-CM

## 2021-02-28 DIAGNOSIS — I739 Peripheral vascular disease, unspecified: Secondary | ICD-10-CM

## 2021-03-16 ENCOUNTER — Ambulatory Visit: Payer: 59 | Admitting: Cardiology

## 2021-03-16 DIAGNOSIS — R931 Abnormal findings on diagnostic imaging of heart and coronary circulation: Secondary | ICD-10-CM | POA: Insufficient documentation

## 2021-03-16 DIAGNOSIS — E7841 Elevated Lipoprotein(a): Secondary | ICD-10-CM | POA: Insufficient documentation

## 2021-03-16 NOTE — Progress Notes (Signed)
Error

## 2021-03-17 ENCOUNTER — Other Ambulatory Visit: Payer: Self-pay

## 2021-03-17 ENCOUNTER — Encounter: Payer: Self-pay | Admitting: Cardiology

## 2021-03-17 ENCOUNTER — Ambulatory Visit: Payer: 59 | Admitting: Cardiology

## 2021-03-17 VITALS — BP 124/82 | HR 61 | Temp 98.0°F | Resp 16 | Ht 62.0 in | Wt 132.0 lb

## 2021-03-17 DIAGNOSIS — I10 Essential (primary) hypertension: Secondary | ICD-10-CM

## 2021-03-17 DIAGNOSIS — E7841 Elevated Lipoprotein(a): Secondary | ICD-10-CM

## 2021-03-17 DIAGNOSIS — R931 Abnormal findings on diagnostic imaging of heart and coronary circulation: Secondary | ICD-10-CM

## 2021-03-17 MED ORDER — ROSUVASTATIN CALCIUM 20 MG PO TABS: 20.0000 mg | ORAL_TABLET | Freq: Every day | ORAL | 3 refills | Status: DC

## 2021-03-17 MED ORDER — ROSUVASTATIN CALCIUM 20 MG PO TABS: 10.0000 mg | ORAL_TABLET | Freq: Every day | ORAL | 3 refills | Status: DC

## 2021-03-17 NOTE — Progress Notes (Signed)
Patient referred by Janith Lima, MD for coronary calcification  Subjective:   Katrina Bartlett, female    DOB: 05-23-1975, 45 y.o.   MRN: [id number]   Chief Complaint  Patient presents with   Coronary artery disease of native artery of native heart wi   Follow-up   Results     HPI  45 year old female with hypertension, former smoker, family history of early CAD, referred for admission clinical history patient seen on CT scan.    Patient has not had any recurrent chest pain. Reviewed recent test results with the patient, details below.   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  However, patient is very active, runs regularly.  She ran 7 miles this morning and 1 hour 10 minutes.  She is currently training for half marathon.  She runs two half marathons every year.  Her last half marathon was in February 2022 in [place] where she clocked just under 10-minute a mile.  On further specific questioning, she does report some exertional dyspnea.  Her running speed used to be about 9-minute a mile 3 years ago.  During early days of COVID, she did not run any half marathon. She has noticed gradual decline in her speed, now about 10-minute mile.  On specific questioning, she reports some "leg fatigue" while running, more than before.  She does report right-sided chest pain that does sometimes occur during running, and resolves with rest.  That said, she has had similar chest pains for several years ever since she lost her brother about 20 years ago (not due to cardiac cause).   Patient used to smoke about half to 1 pack a day, smoked for close to 12 years.  She has now been quit for 14 years.  Patient's mother had hyperlipidemia and hypertension.  Her maternal grandfather had a massive MI at age 34.    Recently, she underwent chest x-ray and CT scan due to complaints of chronic cough.  In addition to some pulmonary nodules, this  showed advanced aorta and coronary artery calcification for her age.  She was thus referred to me.    Current Outpatient Medications on File Prior to Visit  Medication Sig Dispense Refill   COLLAGEN PO Take by mouth.     losartan (COZAAR) 100 MG tablet TAKE 1 TABLET(100 MG) BY MOUTH DAILY 90 tablet 1   metoprolol tartrate (LOPRESSOR) 50 MG tablet Take 1 tablet (50 mg total) by mouth as directed. Take 1 pill twice a day starting 3 days before CT scan Take morning dose 2 hour before CT scan You may stop it thereafter 10 tablet 1   potassium chloride SA (KLOR-CON) 20 MEQ tablet TAKE 1 TABLET(20 MEQ) BY MOUTH TWICE DAILY 180 tablet 1   Probiotic Product (PROBIOTIC-10 PO) Take by mouth.     rosuvastatin (CRESTOR) 10 MG tablet Take 1 tablet (10 mg total) by mouth daily. 30 tablet 3   spironolactone (ALDACTONE) 50 MG tablet Take 1 tablet (50 mg total) by mouth daily. 90 tablet 0   SUMAtriptan (IMITREX) 100 MG tablet TAKE 1 TABLET BY MOUTH EVERY 2 HOURS AS NEEDED FOR MIGRAINE 10 tablet 3   torsemide (DEMADEX) 10 MG tablet Take 1 tablet (10 mg total) by mouth daily. 90 tablet 0   No current facility-administered medications on file prior to visit.    Cardiovascular and other pertinent studies:  Coronary CTA 02/22/2021: 1. Total coronary calcium  score of 22.9. This was 98th percentile for age and sex matched control. 2. Normal coronary origin with right dominance. 3. CAD-RADS = 2.   LM: Patent. LAD: Mild stenosis (25-49%) proximal/mid LAD due to calcified plaque, Mid to apical LAD is patent. LCX: Patent. RCA: Minimal stenosis (0-24%) within the proximal RCA due to calcified plaque otherwise no evidence of plaque or stenosis.   4.  Aortic atherosclerosis.   RECOMMENDATIONS:   Mild non-obstructive CAD (25-49%). Consider non-atherosclerotic causes of chest pain. Consider preventive therapy and risk factor modification.     Echocardiogram 02/28/2021:  Left ventricle cavity is normal in  size and wall thickness. Normal global  wall motion. Normal LV systolic function with EF 56%. Normal diastolic  filling pattern.  Left atrial cavity is mildly dilated. Thin interatrial septum with no 2D  or Doppler evidence of patent foramen ovale.  Mild (Grade I) mitral regurgitation.  Mild tricuspid regurgitation.  No evidence of pulmonary hypertension.  Lower Extremity Arterial Duplex 02/28/2021:  No hemodynamically significant stenoses are identified in the right or  left  lower extremity arterial system.  There is normal triphasic waveform  pattern throughout the lower extremity.  This exam reveals mildly decreased perfusion of the right lower extremity,  noted at the dorsalis pedis and post tibial artery level (ABI 0.96) and  mildly decreased perfusion of the left lower extremity, noted at the  dorsalis pedis and post tibial artery level (ABI 0.88).  Consider evaluation for pseudo claudication.  EKG 02/10/2021: Sinus rhythm 70 bpm  Normal EKG  CT chest 01/20/2021: Scattered atherosclerotic calcifications including advanced coronary arterial calcification for age.   Volume loss/atelectasis in RIGHT middle lobe with mild bronchiectasis and scattered mucous plugging, likely post inflammatory.   Scattered BILATERAL pulmonary nodules up to 5 mm diameter; no follow-up needed if patient is low-risk (and has no known or suspected primary neoplasm). Non-contrast chest CT can be considered in 12 months if patient is high-risk. This recommendation follows the consensus statement: Guidelines for Management of Incidental Pulmonary Nodules Detected on CT Images: From the Fleischner Society 2017; Radiology 2017; 284:228-243.    Recent labs: 02/20/2021: Lipoprotein (a) 186.6 High  01/10/2021: Glucose 92, BUN/Cr 11/0.66. EGFR 105. Na/K 134/3.9. Rest of the CMP normal H/H 13/39. MCV 91. Platelets 223 HbA1C N/A Chol 180, TG 60, HDL 64, LDL 104 TSH N/A Creatinine/aldosterone ratio  normal.   Renin activity 10 (<5.8)   Review of Systems  Cardiovascular:  Positive for chest pain, claudication and dyspnea on exertion. Negative for leg swelling, palpitations and syncope.        Vitals:   03/17/21 1421  BP: 124/82  Pulse: 61  Resp: 16  Temp: 98 F (36.7 C)  SpO2: 100%     Body mass index is 24.14 kg/m. Filed Weights   03/17/21 1421  Weight: 132 lb (59.9 kg)     Objective:   Physical Exam Vitals and nursing note reviewed.  Constitutional:      General: She is not in acute distress. Neck:     Vascular: No JVD.  Cardiovascular:     Rate and Rhythm: Normal rate and regular rhythm.     Pulses:          Dorsalis pedis pulses are 0 on the right side and 2+ on the left side.       Posterior tibial pulses are 2+ on the right side and 2+ on the left side.     Heart sounds: Normal heart sounds. No  murmur heard. Pulmonary:     Effort: Pulmonary effort is normal.     Breath sounds: Normal breath sounds. No wheezing or rales.  Musculoskeletal:     Right lower leg: No edema.     Left lower leg: No edema.        Assessment & Recommendations:    45 year old female with hypertension, former smoker, elevated coronary calcium score, elevated lipoprotein (a), family history of early CAD  Coronary calcification/coronary artery disease: Coronary calcium score 22, 98th percentile (02/2021) No obstructive CAD on CCTA (02/2021) Elevated lipoprotein (a) 186 Okay to stop Aspirin Recommend aggressive risk factor modification Increase Crestor to 20 mg to goal LDL <70. If remains >70, will add Repatha. In future, she could benefit from targeted therapy to reduce LP (a), when available  Hypertension: Controlled  Hyperlipidemia: As above  Claudication: Psuedoclaudication. No signifciant PAD.   Lipid panel and f/u in 3 months   Nigel Mormon, MD Pager: [phone] Office: [phone]

## 2021-04-13 ENCOUNTER — Other Ambulatory Visit: Payer: Self-pay | Admitting: Internal Medicine

## 2021-04-13 DIAGNOSIS — I10 Essential (primary) hypertension: Secondary | ICD-10-CM

## 2021-05-05 LAB — LIPID PANEL
Chol/HDL Ratio: 2.1 ratio (ref 0.0–4.4)
Cholesterol, Total: 163 mg/dL (ref 100–199)
HDL: 77 mg/dL (ref >39)
LDL Chol Calc (NIH): 73 mg/dL (ref 0–99)
Triglycerides: 65 mg/dL (ref 0–149)
VLDL Cholesterol Cal: 13 mg/dL (ref 5–40)

## 2021-05-23 ENCOUNTER — Ambulatory Visit: Payer: 59

## 2021-05-25 ENCOUNTER — Ambulatory Visit
Admission: RE | Admit: 2021-05-25 | Discharge: 2021-05-25 | Disposition: A | Discharge status: Home / Self Care | Payer: 59 | Source: Ambulatory Visit | Attending: Internal Medicine | Admitting: Internal Medicine

## 2021-05-25 DIAGNOSIS — Z1231 Encounter for screening mammogram for malignant neoplasm of breast: Secondary | ICD-10-CM

## 2021-05-26 ENCOUNTER — Other Ambulatory Visit: Payer: Self-pay

## 2021-05-26 ENCOUNTER — Encounter: Payer: Self-pay | Admitting: Cardiology

## 2021-05-26 ENCOUNTER — Ambulatory Visit: Payer: 59 | Admitting: Cardiology

## 2021-05-26 VITALS — BP 128/86 | HR 62 | Temp 97.4°F | Resp 16 | Ht 62.0 in | Wt 131.0 lb

## 2021-05-26 DIAGNOSIS — E7841 Elevated Lipoprotein(a): Secondary | ICD-10-CM

## 2021-05-26 DIAGNOSIS — R931 Abnormal findings on diagnostic imaging of heart and coronary circulation: Secondary | ICD-10-CM

## 2021-05-26 DIAGNOSIS — E782 Mixed hyperlipidemia: Secondary | ICD-10-CM

## 2021-05-26 NOTE — Progress Notes (Signed)
Patient referred by Janith Lima, MD for coronary calcification  Subjective:   Katrina Bartlett, female    DOB: January 01, 1976, 46 y.o.   MRN: [id number]   Chief Complaint  Patient presents with   Coronary Artery Disease   Hyperlipidemia   Follow-up    2 month   Results    Calcium score     HPI  46 year old female with hypertension, former smoker, family history of early CAD, referred for admission clinical history patient seen on CT scan.    Patient has not had any recurrent chest pain. Reviewed recent test results with the patient, details below.   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  However, patient is very active, runs regularly.  She ran 7 miles this morning and 1 hour 10 minutes.  She is currently training for half marathon.  She runs two half marathons every year.  Her last half marathon was in February 2022 in [place] where she clocked just under 10-minute a mile.  On further specific questioning, she does report some exertional dyspnea.  Her running speed used to be about 9-minute a mile 3 years ago.  During early days of COVID, she did not run any half marathon. She has noticed gradual decline in her speed, now about 10-minute mile.  On specific questioning, she reports some "leg fatigue" while running, more than before.  She does report right-sided chest pain that does sometimes occur during running, and resolves with rest.  That said, she has had similar chest pains for several years ever since she lost her brother about 20 years ago (not due to cardiac cause).   Patient used to smoke about half to 1 pack a day, smoked for close to 12 years.  She has now been quit for 14 years.  Patient's mother had hyperlipidemia and hypertension.  Her maternal grandfather had a massive MI at age 46.    Recently, she underwent chest x-ray and CT scan due to complaints of chronic cough.  In addition to some pulmonary  nodules, this showed advanced aorta and coronary artery calcification for her age.  She was thus referred to me.    Current Outpatient Medications on File Prior to Visit  Medication Sig Dispense Refill   COLLAGEN PO Take by mouth.     losartan (COZAAR) 100 MG tablet TAKE 1 TABLET(100 MG) BY MOUTH DAILY 90 tablet 1   potassium chloride SA (KLOR-CON) 20 MEQ tablet TAKE 1 TABLET(20 MEQ) BY MOUTH TWICE DAILY 180 tablet 1   Probiotic Product (PROBIOTIC-10 PO) Take by mouth.     rosuvastatin (CRESTOR) 20 MG tablet Take 1 tablet (20 mg total) by mouth daily. 90 tablet 3   spironolactone (ALDACTONE) 50 MG tablet Take 1 tablet (50 mg total) by mouth daily. 90 tablet 0   SUMAtriptan (IMITREX) 100 MG tablet TAKE 1 TABLET BY MOUTH EVERY 2 HOURS AS NEEDED FOR MIGRAINE 10 tablet 3   torsemide (DEMADEX) 10 MG tablet Take 1 tablet (10 mg total) by mouth daily. 90 tablet 0   No current facility-administered medications on file prior to visit.    Cardiovascular and other pertinent studies:  Coronary CTA 02/22/2021: 1. Total coronary calcium score of 22.9. This was 98th percentile for age and sex matched control. 2. Normal coronary origin with right dominance. 3. CAD-RADS = 2.   LM: Patent. LAD: Mild stenosis (25-49%) proximal/mid LAD due to calcified plaque, Mid to  apical LAD is patent. LCX: Patent. RCA: Minimal stenosis (0-24%) within the proximal RCA due to calcified plaque otherwise no evidence of plaque or stenosis.   4.  Aortic atherosclerosis.   RECOMMENDATIONS:   Mild non-obstructive CAD (25-49%). Consider non-atherosclerotic causes of chest pain. Consider preventive therapy and risk factor modification.     Echocardiogram 02/28/2021:  Left ventricle cavity is normal in size and wall thickness. Normal global  wall motion. Normal LV systolic function with EF 56%. Normal diastolic  filling pattern.  Left atrial cavity is mildly dilated. Thin interatrial septum with no 2D  or Doppler  evidence of patent foramen ovale.  Mild (Grade I) mitral regurgitation.  Mild tricuspid regurgitation.  No evidence of pulmonary hypertension.  Lower Extremity Arterial Duplex 02/28/2021:  No hemodynamically significant stenoses are identified in the right or  left  lower extremity arterial system.  There is normal triphasic waveform  pattern throughout the lower extremity.  This exam reveals mildly decreased perfusion of the right lower extremity,  noted at the dorsalis pedis and post tibial artery level (ABI 0.96) and  mildly decreased perfusion of the left lower extremity, noted at the  dorsalis pedis and post tibial artery level (ABI 0.88).  Consider evaluation for pseudo claudication.  EKG 02/10/2021: Sinus rhythm 70 bpm  Normal EKG  CT chest 01/20/2021: Scattered atherosclerotic calcifications including advanced coronary arterial calcification for age.   Volume loss/atelectasis in RIGHT middle lobe with mild bronchiectasis and scattered mucous plugging, likely post inflammatory.   Scattered BILATERAL pulmonary nodules up to 5 mm diameter; no follow-up needed if patient is low-risk (and has no known or suspected primary neoplasm). Non-contrast chest CT can be considered in 12 months if patient is high-risk. This recommendation follows the consensus statement: Guidelines for Management of Incidental Pulmonary Nodules Detected on CT Images: From the Fleischner Society 2017; Radiology 2017; 284:228-243.    Recent labs: 05/04/2021: Chol 163, TG 65, HDL 67, LDL 73  02/20/2021: Lipoprotein (a) 186.6 High  01/10/2021: Glucose 92, BUN/Cr 11/0.66. EGFR 105. Na/K 134/3.9. Rest of the CMP normal H/H 13/39. MCV 91. Platelets 223 HbA1C N/A Chol 180, TG 60, HDL 64, LDL 104 TSH N/A Creatinine/aldosterone ratio normal.   Renin activity 10 (<5.8)   Review of Systems  Cardiovascular:  Positive for chest pain, claudication and dyspnea on exertion. Negative for leg swelling,  palpitations and syncope.        Vitals:   05/26/21 0840  BP: 128/86  Pulse: 62  Resp: 16  Temp: (!) 97.4 F (36.3 C)  SpO2: 99%     Body mass index is 23.96 kg/m. Filed Weights   05/26/21 0840  Weight: 131 lb (59.4 kg)     Objective:   Physical Exam Vitals and nursing note reviewed.  Constitutional:      General: She is not in acute distress. Neck:     Vascular: No JVD.  Cardiovascular:     Rate and Rhythm: Normal rate and regular rhythm.     Pulses:          Dorsalis pedis pulses are 0 on the right side and 2+ on the left side.       Posterior tibial pulses are 2+ on the right side and 2+ on the left side.     Heart sounds: Normal heart sounds. No murmur heard. Pulmonary:     Effort: Pulmonary effort is normal.     Breath sounds: Normal breath sounds. No wheezing or rales.  Musculoskeletal:  Right lower leg: No edema.     Left lower leg: No edema.        Assessment & Recommendations:    46 year old female with hypertension, former smoker, elevated coronary calcium score, elevated lipoprotein (a), family history of early CAD  Coronary calcification/coronary artery disease: Coronary calcium score 22, 98th percentile (02/2021) No obstructive CAD on CCTA (02/2021) Elevated lipoprotein (a) 186 Okay not to take Aspirin Recommend aggressive risk factor modification LDL down to 73 on Crestor to 20 mg. Given that her LDL/HDL ratio was good, and increased dose of statin may not necessarily help to reduce her LPA, I will continue Crestor 20 mg daily. In future, she could benefit from targeted therapy to reduce LP (a), when available  I encouraged her sister, age 3 to get screened for coronary artery disease and elevated lipoprotein a.  Patient's daughter is 7.  In the long-term, recommend lipoprotein a checking for her as well  Hypertension: Controlled  Hyperlipidemia: As above  Lipid panel and f/u in 1 year    Nigel Mormon, MD Pager:  [phone] Office: [phone]

## 2021-05-29 ENCOUNTER — Other Ambulatory Visit: Payer: Self-pay | Admitting: Internal Medicine

## 2021-05-29 DIAGNOSIS — E876 Hypokalemia: Secondary | ICD-10-CM

## 2021-05-29 DIAGNOSIS — I10 Essential (primary) hypertension: Secondary | ICD-10-CM

## 2021-05-29 DIAGNOSIS — T502X5A Adverse effect of carbonic-anhydrase inhibitors, benzothiadiazides and other diuretics, initial encounter: Secondary | ICD-10-CM

## 2021-06-20 ENCOUNTER — Other Ambulatory Visit: Payer: Self-pay | Admitting: Internal Medicine

## 2021-06-20 DIAGNOSIS — G43009 Migraine without aura, not intractable, without status migrainosus: Secondary | ICD-10-CM

## 2021-07-13 LAB — HM PAP SMEAR

## 2021-08-01 ENCOUNTER — Other Ambulatory Visit: Payer: Self-pay | Admitting: Internal Medicine

## 2021-08-01 DIAGNOSIS — T502X5A Adverse effect of carbonic-anhydrase inhibitors, benzothiadiazides and other diuretics, initial encounter: Secondary | ICD-10-CM

## 2021-08-01 DIAGNOSIS — I10 Essential (primary) hypertension: Secondary | ICD-10-CM

## 2021-08-02 ENCOUNTER — Other Ambulatory Visit: Payer: Self-pay

## 2021-08-02 ENCOUNTER — Ambulatory Visit: Payer: 59 | Admitting: Internal Medicine

## 2021-08-02 ENCOUNTER — Encounter: Payer: Self-pay | Admitting: Internal Medicine

## 2021-08-02 VITALS — BP 120/78 | HR 71 | Temp 98.1°F | Resp 18 | Ht 62.0 in | Wt 129.0 lb

## 2021-08-02 DIAGNOSIS — E785 Hyperlipidemia, unspecified: Secondary | ICD-10-CM

## 2021-08-02 DIAGNOSIS — N951 Menopausal and female climacteric states: Secondary | ICD-10-CM | POA: Diagnosis not present

## 2021-08-02 DIAGNOSIS — E876 Hypokalemia: Secondary | ICD-10-CM | POA: Diagnosis not present

## 2021-08-02 DIAGNOSIS — I10 Essential (primary) hypertension: Secondary | ICD-10-CM | POA: Diagnosis not present

## 2021-08-02 DIAGNOSIS — T502X5A Adverse effect of carbonic-anhydrase inhibitors, benzothiadiazides and other diuretics, initial encounter: Secondary | ICD-10-CM | POA: Diagnosis not present

## 2021-08-02 DIAGNOSIS — I739 Peripheral vascular disease, unspecified: Secondary | ICD-10-CM

## 2021-08-02 LAB — BASIC METABOLIC PANEL
BUN: 11 mg/dL (ref 6–23)
CO2: 29 mEq/L (ref 19–32)
Calcium: 9.3 mg/dL (ref 8.4–10.5)
Chloride: 101 mEq/L (ref 96–112)
Creatinine, Ser: 0.64 mg/dL (ref 0.40–1.20)
GFR: 106.28 mL/min (ref >60.00)
Glucose, Bld: 92 mg/dL (ref 70–99)
Potassium: 3.8 mEq/L (ref 3.5–5.1)
Sodium: 138 mEq/L (ref 135–145)

## 2021-08-02 LAB — TSH: TSH: 1.82 u[IU]/mL (ref 0.35–5.50)

## 2021-08-02 LAB — HEPATIC FUNCTION PANEL
ALT: 11 U/L (ref 0–35)
AST: 16 U/L (ref 0–37)
Albumin: 4.7 g/dL (ref 3.5–5.2)
Alkaline Phosphatase: 39 U/L (ref 39–117)
Bilirubin, Direct: 0.1 mg/dL (ref 0.0–0.3)
Total Bilirubin: 0.6 mg/dL (ref 0.2–1.2)
Total Protein: 6.5 g/dL (ref 6.0–8.3)

## 2021-08-02 LAB — CBC WITH DIFFERENTIAL/PLATELET
Basophils Absolute: 0.1 10*3/uL (ref 0.0–0.1)
Basophils Relative: 1.1 % (ref 0.0–3.0)
Eosinophils Absolute: 0.1 10*3/uL (ref 0.0–0.7)
Eosinophils Relative: 1.8 % (ref 0.0–5.0)
HCT: 38.1 % (ref 36.0–46.0)
Hemoglobin: 13.4 g/dL (ref 12.0–15.0)
Lymphocytes Relative: 32.6 % (ref 12.0–46.0)
Lymphs Abs: 1.9 10*3/uL (ref 0.7–4.0)
MCHC: 35.2 g/dL (ref 30.0–36.0)
MCV: 91.3 fl (ref 78.0–100.0)
Monocytes Absolute: 0.4 10*3/uL (ref 0.1–1.0)
Monocytes Relative: 6.9 % (ref 3.0–12.0)
Neutro Abs: 3.3 10*3/uL (ref 1.4–7.7)
Neutrophils Relative %: 57.6 % (ref 43.0–77.0)
Platelets: 222 10*3/uL (ref 150.0–400.0)
RBC: 4.18 Mil/uL (ref 3.87–5.11)
RDW: 11.9 % (ref 11.5–15.5)
WBC: 5.8 10*3/uL (ref 4.0–10.5)

## 2021-08-02 LAB — FOLLICLE STIMULATING HORMONE: FSH: 2.5 m[IU]/mL

## 2021-08-02 LAB — LUTEINIZING HORMONE: LH: 3.06 m[IU]/mL

## 2021-08-02 MED ORDER — SPIRONOLACTONE 50 MG PO TABS: 50.0000 mg | ORAL_TABLET | Freq: Every day | ORAL | 1 refills | Status: DC

## 2021-08-02 MED ORDER — TORSEMIDE 10 MG PO TABS: ORAL_TABLET | ORAL | 1 refills | Status: DC

## 2021-08-02 MED ORDER — ASPIRIN 81 MG PO TBEC: 81.0000 mg | DELAYED_RELEASE_TABLET | Freq: Every day | ORAL | 1 refills | Status: DC

## 2021-08-02 NOTE — Progress Notes (Signed)
? ?Subjective:  ?Patient ID: Katrina Bartlett, female    DOB: Dec 22, 1975  Age: 46 y.o. MRN: [id number] ? ?CC: Hypertension ? ?This visit occurred during the SARS-CoV-2 public health emergency.  Safety protocols were in place, including screening questions prior to the visit, additional usage of staff PPE, and extensive cleaning of exam room while observing appropriate contact time as indicated for disinfecting solutions.   ? ?HPI ?Katrina Bartlett presents for f/up - ? ?She complains of a one month hx of hot flashes. ? ?Outpatient Medications Prior to Visit  ?Medication Sig Dispense Refill  ? COLLAGEN PO Take by mouth.    ? losartan (COZAAR) 100 MG tablet TAKE 1 TABLET(100 MG) BY MOUTH DAILY 90 tablet 1  ? potassium chloride SA (KLOR-CON) 20 MEQ tablet TAKE 1 TABLET(20 MEQ) BY MOUTH TWICE DAILY 180 tablet 1  ? Probiotic Product (PROBIOTIC-10 PO) Take by mouth.    ? SUMAtriptan (IMITREX) 100 MG tablet TAKE 1 TABLET BY MOUTH EVERY 2 HOURS AS NEEDED FOR MIGRAINE 10 tablet 3  ? spironolactone (ALDACTONE) 50 MG tablet TAKE 1 TABLET(50 MG) BY MOUTH DAILY 90 tablet 0  ? torsemide (DEMADEX) 10 MG tablet TAKE 1 TABLET(10 MG) BY MOUTH DAILY 90 tablet 0  ? rosuvastatin (CRESTOR) 20 MG tablet Take 1 tablet (20 mg total) by mouth daily. 90 tablet 3  ? ?No facility-administered medications prior to visit.  ? ? ?ROS ?Review of Systems  ?Constitutional:  Negative for chills, diaphoresis, fatigue and fever.  ?HENT: Negative.    ?Eyes: Negative.   ?Respiratory:  Negative for cough, chest tightness, shortness of breath and wheezing.   ?Cardiovascular:  Negative for chest pain, palpitations and leg swelling.  ?Gastrointestinal:  Negative for abdominal pain, constipation, diarrhea, nausea and vomiting.  ?Endocrine: Negative.   ?Genitourinary: Negative.  Negative for difficulty urinating.  ?Musculoskeletal: Negative.   ?     Feet feel cold  ?Skin: Negative.   ?Hematological:  Negative for adenopathy. Does not bruise/bleed easily.   ?Psychiatric/Behavioral: Negative.    ? ?Objective:  ?BP 120/78 (BP Location: Left Arm, Patient Position: Sitting, Cuff Size: Large)   Pulse 71   Temp 98.1 ?F (36.7 ?C) (Oral)   Resp 18   Ht 5\' 2"  (1.575 m)   Wt 129 lb (58.5 kg)   BMI 23.59 kg/m?  ? ?BP Readings from Last 3 Encounters:  ?08/02/21 120/78  ?05/26/21 128/86  ?03/17/21 124/82  ? ? ?Wt Readings from Last 3 Encounters:  ?08/02/21 129 lb (58.5 kg)  ?05/26/21 131 lb (59.4 kg)  ?03/17/21 132 lb (59.9 kg)  ? ? ?Physical Exam ?Vitals reviewed.  ?HENT:  ?   Nose: Nose normal.  ?   Mouth/Throat:  ?   Mouth: Mucous membranes are moist.  ?Eyes:  ?   General: No scleral icterus. ?   Conjunctiva/sclera: Conjunctivae normal.  ?Cardiovascular:  ?   Rate and Rhythm: Normal rate and regular rhythm.  ?   Pulses:     ?     Dorsalis pedis pulses are 1+ on the right side and 2+ on the left side.  ?     Posterior tibial pulses are 1+ on the right side and 2+ on the left side.  ?   Heart sounds: No murmur heard. ?Pulmonary:  ?   Effort: Pulmonary effort is normal.  ?   Breath sounds: No stridor. No wheezing, rhonchi or rales.  ?Abdominal:  ?   General: Abdomen is flat.  ?   Palpations: There is  no mass.  ?   Tenderness: There is no abdominal tenderness. There is no guarding.  ?   Hernia: No hernia is present.  ?Musculoskeletal:     ?   General: Normal range of motion.  ?   Cervical back: Neck supple.  ?   Right lower leg: No edema.  ?   Left lower leg: No edema.  ?     Feet: ? ?Feet:  ?   Right foot:  ?   Skin integrity: Skin integrity normal.  ?   Toenail Condition: Right toenails are normal.  ?   Left foot:  ?   Skin integrity: Skin integrity normal.  ?   Toenail Condition: Left toenails are normal.  ?Lymphadenopathy:  ?   Cervical: No cervical adenopathy.  ?Skin: ?   General: Skin is warm and dry.  ?   Coloration: Skin is not pale.  ?Neurological:  ?   General: No focal deficit present.  ?   Mental Status: She is alert.  ?Psychiatric:     ?   Mood and Affect: Mood  normal.     ?   Behavior: Behavior normal.  ? ? ?Lab Results  ?Component Value Date  ? WBC 5.8 08/02/2021  ? HGB 13.4 08/02/2021  ? HCT 38.1 08/02/2021  ? PLT 222.0 08/02/2021  ? GLUCOSE 92 08/02/2021  ? CHOL 163 05/04/2021  ? TRIG 65 05/04/2021  ? HDL 77 05/04/2021  ? LDLCALC 73 05/04/2021  ? ALT 11 08/02/2021  ? AST 16 08/02/2021  ? NA 138 08/02/2021  ? K 3.8 08/02/2021  ? CL 101 08/02/2021  ? CREATININE 0.64 08/02/2021  ? BUN 11 08/02/2021  ? CO2 29 08/02/2021  ? TSH 1.82 08/02/2021  ? ? ?MM 3D SCREEN BREAST BILATERAL ? ?Result Date: 05/26/2021 ?CLINICAL DATA:  Screening. EXAM: DIGITAL SCREENING BILATERAL MAMMOGRAM WITH TOMOSYNTHESIS AND CAD TECHNIQUE: Bilateral screening digital craniocaudal and mediolateral oblique mammograms were obtained. Bilateral screening digital breast tomosynthesis was performed. The images were evaluated with computer-aided detection. COMPARISON:  Previous exam(s). ACR Breast Density Category c: The breast tissue is heterogeneously dense, which may obscure small masses. FINDINGS: There are no findings suspicious for malignancy. IMPRESSION: No mammographic evidence of malignancy. A result letter of this screening mammogram will be mailed directly to the patient. RECOMMENDATION: Screening mammogram in one year. (Code:SM-B-01Y) BI-RADS CATEGORY  1: Negative. Electronically Signed   By: Annia Belt M.D.   On: 05/26/2021 08:31  ? ? ?Assessment & Plan:  ? ?[name] was seen today for hypertension. ? ?Diagnoses and all orders for this visit: ? ?Essential hypertension- Her BP is well controlled. ?-     Basic metabolic panel; Future ?-     TSH; Future ?-     CBC with Differential/Platelet; Future ?-     CBC with Differential/Platelet ?-     TSH ?-     Basic metabolic panel ? ?Diuretic-induced hypokalemia- Her K+ is normal now. ?-     Basic metabolic panel; Future ?-     Basic metabolic panel ?-     spironolactone (ALDACTONE) 50 MG tablet; Take 1 tablet (50 mg total) by mouth daily. ? ?Hot flashes  due to menopause- Labs are normal. ?-     TSH; Future ?-     Luteinizing hormone; Future ?-     Follicle stimulating hormone; Future ?-     Follicle stimulating hormone ?-     Luteinizing hormone ?-     TSH ? ?Hyperlipidemia  LDL goal <70- LDL goal achieved. Doing well on the statin  ?-     Hepatic function panel; Future ?-     Hepatic function panel ? ?Claudication [facility](HCC) ?-     rivaroxaban (XARELTO) 2.5 MG TABS tablet; Take 1 tablet (2.5 mg total) by mouth 2 (two) times daily. ? ?PAD (peripheral artery disease) (HCC)- Will start asa and xarelto. ?-     aspirin 81 MG EC tablet; Take 1 tablet (81 mg total) by mouth daily. Swallow whole. ?-     rivaroxaban (XARELTO) 2.5 MG TABS tablet; Take 1 tablet (2.5 mg total) by mouth 2 (two) times daily. ? ?Primary hypertension- Her BP is well controlled. ?-     spironolactone (ALDACTONE) 50 MG tablet; Take 1 tablet (50 mg total) by mouth daily. ?-     torsemide (DEMADEX) 10 MG tablet; TAKE 1 TABLET(10 MG) BY MOUTH DAILY ? ? ?I have changed Fayette Speranza's spironolactone. I am also having her start on aspirin and rivaroxaban. Additionally, I am having her maintain her potassium chloride SA, COLLAGEN PO, Probiotic Product (PROBIOTIC-10 PO), rosuvastatin, losartan, SUMAtriptan, and torsemide. ? ?Meds ordered this encounter  ?Medications  ? aspirin 81 MG EC tablet  ?  Sig: Take 1 tablet (81 mg total) by mouth daily. Swallow whole.  ?  Dispense:  90 tablet  ?  Refill:  1  ? spironolactone (ALDACTONE) 50 MG tablet  ?  Sig: Take 1 tablet (50 mg total) by mouth daily.  ?  Dispense:  90 tablet  ?  Refill:  1  ? torsemide (DEMADEX) 10 MG tablet  ?  Sig: TAKE 1 TABLET(10 MG) BY MOUTH DAILY  ?  Dispense:  90 tablet  ?  Refill:  1  ? rivaroxaban (XARELTO) 2.5 MG TABS tablet  ?  Sig: Take 1 tablet (2.5 mg total) by mouth 2 (two) times daily.  ?  Dispense:  180 tablet  ?  Refill:  1  ? ? ? ?Follow-up: Return in about 6 months (around 02/02/2022). ? ?Sanda Lingerhomas Caitlyne Ingham, MD ?

## 2021-08-02 NOTE — Patient Instructions (Signed)
Peripheral Vascular Disease ?Peripheral vascular disease (PVD) is a disease of the blood vessels. PVD may also be called peripheral artery disease (PAD) or poor circulation. PVD is the blocking or hardening of the arteries anywhere within the circulatory system beyond the heart. This can result in a decreased supply of blood to the arms, legs, and internal organs, such as the stomach or kidneys. However, PVD most often affects a person's lower legs and feet. Without treatment, PVD often worsens. ?PVD can lead to acute limb ischemia. This occurs when an arm or leg suddenly has trouble getting enough blood. This is a medical emergency. ?What are the causes? ?The most common cause of PVD is atherosclerosis. This is a buildup of fatty material and other substances (plaque)inside your arteries. Pieces of plaque can break off from the walls of an artery and become stuck in a smaller artery, blocking blood flow and possibly causing acute limb ischemia. ?Other common causes of PVD include: ?Blood clots that form inside the blood vessels. ?Injuries to blood vessels. ?Diseases that cause inflammation of blood vessels or cause blood vessel tightening (spasms). ?What increases the risk? ?The following factors may make you more likely to develop this condition: ?A family history of PVD. ?Common medical conditions, including: ?High cholesterol. ?Diabetes. ?High blood pressure (hypertension). ?Heart disease. ?Known atherosclerotic disease in another area of the body. ?Past injury, such as burns or a broken bone. ?Other medical conditions, such as: ?Buerger's disease. This is caused by inflamed blood vessels in your hands and feet. ?Some forms of arthritis. ?Birth defects that affect the arteries in your legs. ?Kidney disease. ?Using tobacco and nicotine products. ?Not getting enough exercise. ?Obesity. ?Being age 65 or older, or being age 50 or older and having the other risk factors. ?What are the signs or symptoms? ?This  condition may cause different symptoms. Your symptoms depend on what body part is not getting enough blood. Common signs and symptoms include: ?Cramps in your buttocks, legs, and feet. ?Intermittent claudication. This is pain and weakness in your legs during activity that resolves with rest. ?Leg pain at rest and leg numbness, tingling, or weakness. ?Coldness in a leg or foot, especially when compared to the other leg or foot. ?Skin or hair changes. These can include: ?Hair loss. ?Shiny skin. ?Pale or bluish skin. ?Thick toenails. ?Inability to get or maintain an erection (erectile dysfunction). ?Tiredness (fatigue). ?Weak pulse or no pulse in the feet. ?People with PVD are more likely to develop open wounds (ulcers) and sores on their toes, feet, or legs. The ulcers or sores may take longer than normal to heal. ?How is this diagnosed? ?PVD is diagnosed based on your signs and symptoms, a physical exam, and your medical history. You may also have other tests to find the cause. Tests include: ?Ankle-brachial index test.This test compares the blood pressure readings of the legs and arms. ?This may also include an exercise ankle-brachial index test in which you walk on a treadmill to check your symptoms. ?Doppler ultrasound. This takes pictures of blood flow through your blood vessels. ?Imaging studies that use dye to show blood flow. These are: ?CT angiogram. ?Magnetic resonance angiogram, or MRA. ?How is this treated? ?Treatment for PVD depends on the cause of your condition, how severe your symptoms are, and your age. Underlying causes need to be treated and controlled. These include long-term (chronic) conditions, such as diabetes, high cholesterol, and hypertension. Treatment may include: ?Lifestyle changes, such as: ?Quitting tobacco use. ?Exercising regularly. ?Following a low-fat,   low-cholesterol diet. ?Not drinking alcohol. ?Taking medicines, such as: ?Blood thinners to prevent blood clots. ?Medicines to  improve blood flow. ?Medicines to improve cholesterol levels. ?Procedures, such as: ?Angioplasty. This uses an inflated balloon to open a blocked artery and improve blood flow. ?Stent implant. This inserts a small mesh tube to keep a blocked artery open. ?Peripheral bypass surgery. This reroutes blood flow around a blocked artery. ?Surgery to remove dead tissue from an infected wound (debridement). ?Amputation. This is surgical removal of the affected limb. It may be necessary in cases of acute limb ischemia when medical or surgical treatments have not helped. ?Follow these instructions at home: ?Medicines ?Take over-the-counter and prescription medicines only as told by your health care provider. ?If you are taking blood thinners: ?Talk with your health care provider before you take any medicines that contain aspirin or NSAIDs, such as ibuprofen. These medicines increase your risk for dangerous bleeding. ?Take your medicine exactly as told, at the same time every day. ?Avoid activities that could cause injury or bruising, and follow instructions about how to prevent falls. ?Wear a medical alert bracelet or carry a card that lists what medicines you take. ?Lifestyle ?  ?Exercise regularly. Ask your health care provider about some good activities for you. ?Talk with your health care provider about maintaining a healthy weight. If needed, ask about losing weight. ?Eat a diet that is low in fat and cholesterol. If you need help, talk with your health care provider. ?Do not drink alcohol. ?Do not use any products that contain nicotine or tobacco. These products include cigarettes, chewing tobacco, and vaping devices, such as e-cigarettes. If you need help quitting, ask your health care provider. ?General instructions ?Take good care of your feet. To do this: ?Wear comfortable shoes that fit well. ?Check your feet often for any cuts or sores. ?Get an annual influenza vaccine. ?Keep all follow-up visits. This is  important. ?Where to find more information ?Society for Vascular Surgery: vascular.org ?American Heart Association: heart.org ?National Heart, Lung, and Blood Institute: nhlbi.nih.gov ?Contact a health care provider if: ?You have leg cramps while walking. ?You have leg pain when you rest. ?Your leg or foot feels cold. ?Your skin changes color. ?You have erectile dysfunction. ?You have cuts or sores on your legs or feet that do not heal. ?Get help right away if: ?You have sudden changes in color and feeling of your arms or legs, such as: ?Your arm or leg turns cold, numb, and blue. ?Your arm or leg becomes red, warm, swollen, painful, or numb. ?You have any symptoms of a stroke. "BE FAST" is an easy way to remember the main warning signs of a stroke: ?B - Balance. Signs are dizziness, sudden trouble walking, or loss of balance. ?E - Eyes. Signs are trouble seeing or a sudden change in vision. ?F - Face. Signs are sudden weakness or numbness of the face, or the face or eyelid drooping on one side. ?A - Arms. Signs are weakness or numbness in an arm. This happens suddenly and usually on one side of the body. ?S - Speech. Signs are sudden trouble speaking, slurred speech, or trouble understanding what people say. ?T - Time. Time to call emergency services. Write down what time symptoms started. ?You have other signs of a stroke, such as: ?A sudden, severe headache with no known cause. ?Nausea or vomiting. ?Seizure. ?You have chest pain or trouble breathing. ?These symptoms may represent a serious problem that is an emergency. Do not wait   to see if the symptoms will go away. Get medical help right away. Call your local emergency services (911 in the [place] Do not drive yourself to the hospital. ?Summary ?Peripheral vascular disease (PVD) is a disease of the blood vessels. ?PVD is the blocking or hardening of the arteries anywhere within the circulatory system beyond the heart. ?PVD may cause different symptoms. Your  symptoms depend on what part of your body is not getting enough blood. ?Treatment for PVD depends on what caused it, how severe your symptoms are, and your age. ?This information is not intended to replace advice given to you

## 2021-08-04 MED ORDER — RIVAROXABAN 2.5 MG PO TABS: 2.5000 mg | ORAL_TABLET | Freq: Two times a day (BID) | ORAL | 1 refills | Status: DC

## 2021-08-07 ENCOUNTER — Encounter: Payer: Self-pay | Admitting: Internal Medicine

## 2021-08-08 ENCOUNTER — Other Ambulatory Visit: Payer: Self-pay | Admitting: Internal Medicine

## 2021-10-24 ENCOUNTER — Other Ambulatory Visit: Payer: Self-pay | Admitting: Internal Medicine

## 2021-10-24 DIAGNOSIS — G43009 Migraine without aura, not intractable, without status migrainosus: Secondary | ICD-10-CM

## 2021-10-31 ENCOUNTER — Encounter: Payer: Self-pay | Admitting: Internal Medicine

## 2021-10-31 DIAGNOSIS — E876 Hypokalemia: Secondary | ICD-10-CM

## 2021-10-31 DIAGNOSIS — I10 Essential (primary) hypertension: Secondary | ICD-10-CM

## 2021-10-31 MED ORDER — POTASSIUM CHLORIDE CRYS ER 20 MEQ PO TBCR: EXTENDED_RELEASE_TABLET | ORAL | 1 refills | Status: DC

## 2021-12-05 ENCOUNTER — Encounter: Payer: Self-pay | Admitting: Internal Medicine

## 2021-12-08 ENCOUNTER — Encounter: Payer: Self-pay | Admitting: Internal Medicine

## 2021-12-08 ENCOUNTER — Ambulatory Visit: Payer: 59 | Admitting: Internal Medicine

## 2021-12-08 VITALS — BP 124/78 | HR 62 | Temp 98.0°F | Resp 16 | Ht 62.0 in | Wt 129.0 lb

## 2021-12-08 DIAGNOSIS — I10 Essential (primary) hypertension: Secondary | ICD-10-CM | POA: Diagnosis not present

## 2021-12-08 DIAGNOSIS — T502X5A Adverse effect of carbonic-anhydrase inhibitors, benzothiadiazides and other diuretics, initial encounter: Secondary | ICD-10-CM | POA: Diagnosis not present

## 2021-12-08 DIAGNOSIS — E876 Hypokalemia: Secondary | ICD-10-CM | POA: Diagnosis not present

## 2021-12-08 LAB — CBC WITH DIFFERENTIAL/PLATELET
Basophils Absolute: 0.1 10*3/uL (ref 0.0–0.1)
Basophils Relative: 2.5 % (ref 0.0–3.0)
Eosinophils Absolute: 0.1 10*3/uL (ref 0.0–0.7)
Eosinophils Relative: 1.8 % (ref 0.0–5.0)
HCT: 40 % (ref 36.0–46.0)
Hemoglobin: 14 g/dL (ref 12.0–15.0)
Lymphocytes Relative: 32.8 % (ref 12.0–46.0)
Lymphs Abs: 1.6 10*3/uL (ref 0.7–4.0)
MCHC: 34.9 g/dL (ref 30.0–36.0)
MCV: 91.9 fl (ref 78.0–100.0)
Monocytes Absolute: 0.3 10*3/uL (ref 0.1–1.0)
Monocytes Relative: 6.4 % (ref 3.0–12.0)
Neutro Abs: 2.7 10*3/uL (ref 1.4–7.7)
Neutrophils Relative %: 56.5 % (ref 43.0–77.0)
Platelets: 220 10*3/uL (ref 150.0–400.0)
RBC: 4.35 Mil/uL (ref 3.87–5.11)
RDW: 12.3 % (ref 11.5–15.5)
WBC: 4.7 10*3/uL (ref 4.0–10.5)

## 2021-12-08 LAB — BASIC METABOLIC PANEL
BUN: 18 mg/dL (ref 6–23)
CO2: 28 mEq/L (ref 19–32)
Calcium: 9.3 mg/dL (ref 8.4–10.5)
Chloride: 98 mEq/L (ref 96–112)
Creatinine, Ser: 0.76 mg/dL (ref 0.40–1.20)
GFR: 94 mL/min (ref >60.00)
Glucose, Bld: 89 mg/dL (ref 70–99)
Potassium: 4.3 mEq/L (ref 3.5–5.1)
Sodium: 134 mEq/L — ABNORMAL LOW (ref 135–145)

## 2021-12-08 LAB — MAGNESIUM: Magnesium: 1.8 mg/dL (ref 1.5–2.5)

## 2021-12-08 MED ORDER — TORSEMIDE 20 MG PO TABS: 20.0000 mg | ORAL_TABLET | Freq: Every day | ORAL | 0 refills | Status: DC

## 2021-12-08 NOTE — Patient Instructions (Signed)
Peripheral Edema  Peripheral edema is swelling that is caused by a buildup of fluid. Peripheral edema most often affects the lower legs, ankles, and feet. It can also develop in the arms, hands, and face. The area of the body that has peripheral edema will look swollen. It may also feel heavy or warm. Your clothes may start to feel tight. Pressing on the area may make a temporary dent in your skin (pitting edema). You may not be able to move your swollen arm or leg as much as usual. There are many causes of peripheral edema. It can happen because of a complication of other conditions such as heart failure, kidney disease, or a problem with your circulation. It also can be a side effect of certain medicines or happen because of an infection. It often happens to women during pregnancy. Sometimes, the cause is not known. Follow these instructions at home: Managing pain, stiffness, and swelling  Raise (elevate) your legs while you are sitting or lying down. Move around often to prevent stiffness and to reduce swelling. Do not sit or stand for long periods of time. Do not wear tight clothing. Do not wear garters on your upper legs. Exercise your legs to get your circulation going. This helps to move the fluid back into your blood vessels, and it may help the swelling go down. Wear compression stockings as told by your health care provider. These stockings help to prevent blood clots and reduce swelling in your legs. It is important that these are the correct size. These stockings should be prescribed by your doctor to prevent possible injuries. If elastic bandages or wraps are recommended, use them as told by your health care provider. Medicines Take over-the-counter and prescription medicines only as told by your health care provider. Your health care provider may prescribe medicine to help your body get rid of excess water (diuretic). Take this medicine if you are told to take it. General  instructions Eat a low-salt (low-sodium) diet as told by your health care provider. Sometimes, eating less salt may reduce swelling. Pay attention to any changes in your symptoms. Moisturize your skin daily to help prevent skin from cracking and draining. Keep all follow-up visits. This is important. Contact a health care provider if: You have a fever. You have swelling in only one leg. You have increased swelling, redness, or pain in one or both of your legs. You have drainage or sores at the area where you have edema. Get help right away if: You have edema that starts suddenly or is getting worse, especially if you are pregnant or have a medical condition. You develop shortness of breath, especially when you are lying down. You have pain in your chest or abdomen. You feel weak. You feel like you will faint. These symptoms may be an emergency. Get help right away. Call 911. Do not wait to see if the symptoms will go away. Do not drive yourself to the hospital. Summary Peripheral edema is swelling that is caused by a buildup of fluid. Peripheral edema most often affects the lower legs, ankles, and feet. Move around often to prevent stiffness and to reduce swelling. Do not sit or stand for long periods of time. Pay attention to any changes in your symptoms. Contact a health care provider if you have edema that starts suddenly or is getting worse, especially if you are pregnant or have a medical condition. Get help right away if you develop shortness of breath, especially when lying down.   This information is not intended to replace advice given to you by your health care provider. Make sure you discuss any questions you have with your health care provider. Document Revised: 01/09/2021 Document Reviewed: 01/09/2021 Elsevier Patient Education  2023 [employer]  

## 2021-12-08 NOTE — Progress Notes (Signed)
Subjective:  Patient ID: Katrina Bartlett, female    DOB: 07/15/1975  Age: 46 y.o. MRN: [id number]  CC: Hypertension   HPI Katrina Bartlett presents for f/up -   She complains of a 6-week history of lower extremity edema and  LE tightness late in the day.  She has doubled the dose of torsemide and the symptoms have improved.  She denies claudication.  She is active and denies chest pain, shortness of breath, weight gain, or diaphoresis.  Outpatient Medications Prior to Visit  Medication Sig Dispense Refill   aspirin 81 MG EC tablet Take 1 tablet (81 mg total) by mouth daily. Swallow whole. 90 tablet 1   COLLAGEN PO Take by mouth.     losartan (COZAAR) 100 MG tablet TAKE 1 TABLET(100 MG) BY MOUTH DAILY 90 tablet 1   potassium chloride SA (KLOR-CON M) 20 MEQ tablet TAKE 1 TABLET(20 MEQ) BY MOUTH TWICE DAILY 180 tablet 1   Probiotic Product (PROBIOTIC-10 PO) Take by mouth.     spironolactone (ALDACTONE) 50 MG tablet Take 1 tablet (50 mg total) by mouth daily. 90 tablet 1   SUMAtriptan (IMITREX) 100 MG tablet TAKE 1 TABLET BY MOUTH EVERY 2 HOURS AS NEEDED FOR MIGRAINE. MAX OF 2 TABLETS PER DAY 10 tablet 2   torsemide (DEMADEX) 10 MG tablet TAKE 1 TABLET(10 MG) BY MOUTH DAILY 90 tablet 1   rosuvastatin (CRESTOR) 20 MG tablet Take 1 tablet (20 mg total) by mouth daily. 90 tablet 3   No facility-administered medications prior to visit.    ROS Review of Systems  Constitutional: Negative.  Negative for diaphoresis and fatigue.  HENT: Negative.    Eyes: Negative.   Respiratory:  Negative for cough, chest tightness, shortness of breath and wheezing.   Cardiovascular:  Positive for leg swelling. Negative for chest pain and palpitations.  Gastrointestinal:  Negative for abdominal pain and diarrhea.  Endocrine: Negative.   Genitourinary:  Negative for dysuria.  Musculoskeletal: Negative.   Skin: Negative.   Neurological:  Negative for dizziness, weakness and light-headedness.   Hematological:  Negative for adenopathy. Does not bruise/bleed easily.  Psychiatric/Behavioral: Negative.      Objective:  BP 124/78 (BP Location: Right Arm, Patient Position: Sitting, Cuff Size: Large)   Pulse 62   Temp 98 F (36.7 C) (Oral)   Resp 16   Ht 5\' 2"  (1.575 m)   Wt 129 lb (58.5 kg)   SpO2 95%   BMI 23.59 kg/m   BP Readings from Last 3 Encounters:  12/08/21 124/78  08/02/21 120/78  05/26/21 128/86    Wt Readings from Last 3 Encounters:  12/08/21 129 lb (58.5 kg)  08/02/21 129 lb (58.5 kg)  05/26/21 131 lb (59.4 kg)    Physical Exam Vitals reviewed.  HENT:     Mouth/Throat:     Mouth: Mucous membranes are moist.  Eyes:     General: No scleral icterus.    Conjunctiva/sclera: Conjunctivae normal.  Cardiovascular:     Rate and Rhythm: Normal rate and regular rhythm.     Pulses:          Dorsalis pedis pulses are 1+ on the right side and 1+ on the left side.       Posterior tibial pulses are 1+ on the right side and 1+ on the left side.     Heart sounds: No murmur heard. Pulmonary:     Effort: Pulmonary effort is normal.     Breath sounds: No stridor. No wheezing,  rhonchi or rales.  Abdominal:     General: Abdomen is flat.     Palpations: There is no mass.     Tenderness: There is no abdominal tenderness. There is no guarding.     Hernia: No hernia is present.  Musculoskeletal:     Cervical back: Neck supple.     Right lower leg: No edema.     Left lower leg: No edema.  Feet:     Right foot:     Skin integrity: Skin integrity normal.     Left foot:     Skin integrity: Skin integrity normal.  Lymphadenopathy:     Cervical: No cervical adenopathy.  Skin:    General: Skin is warm and dry.  Neurological:     General: No focal deficit present.     Mental Status: She is alert.     Lab Results  Component Value Date   WBC 4.7 12/08/2021   HGB 14.0 12/08/2021   HCT 40.0 12/08/2021   PLT 220.0 12/08/2021   GLUCOSE 89 12/08/2021   CHOL 163  05/04/2021   TRIG 65 05/04/2021   HDL 77 05/04/2021   LDLCALC 73 05/04/2021   ALT 11 08/02/2021   AST 16 08/02/2021   NA 134 (L) 12/08/2021   K 4.3 12/08/2021   CL 98 12/08/2021   CREATININE 0.76 12/08/2021   BUN 18 12/08/2021   CO2 28 12/08/2021   TSH 1.82 08/02/2021    MM 3D SCREEN BREAST BILATERAL  Result Date: 05/26/2021 CLINICAL DATA:  Screening. EXAM: DIGITAL SCREENING BILATERAL MAMMOGRAM WITH TOMOSYNTHESIS AND CAD TECHNIQUE: Bilateral screening digital craniocaudal and mediolateral oblique mammograms were obtained. Bilateral screening digital breast tomosynthesis was performed. The images were evaluated with computer-aided detection. COMPARISON:  Previous exam(s). ACR Breast Density Category c: The breast tissue is heterogeneously dense, which may obscure small masses. FINDINGS: There are no findings suspicious for malignancy. IMPRESSION: No mammographic evidence of malignancy. A result letter of this screening mammogram will be mailed directly to the patient. RECOMMENDATION: Screening mammogram in one year. (Code:SM-B-01Y) BI-RADS CATEGORY  1: Negative. Electronically Signed   By: Annia Belt M.D.   On: 05/26/2021 08:31    Assessment & Plan:   Katrina Bartlett was seen today for hypertension.  Diagnoses and all orders for this visit:  Essential hypertension-the edema has resolved with a higher dose of torsemide.  Will continue 20 mg a day. -     Basic metabolic panel; Future -     CBC with Differential/Platelet; Future -     Magnesium; Future -     Magnesium -     CBC with Differential/Platelet -     Basic metabolic panel -     torsemide (DEMADEX) 20 MG tablet; Take 1 tablet (20 mg total) by mouth daily.  Diuretic-induced hypokalemia-her potassium level is normal.  Will continue the current dose of spironolactone. -     Basic metabolic panel; Future -     Magnesium; Future -     Magnesium -     Basic metabolic panel   I have discontinued Katrina Bartlett's torsemide. I am  also having her start on torsemide. Additionally, I am having her maintain her COLLAGEN PO, Probiotic Product (PROBIOTIC-10 PO), rosuvastatin, losartan, aspirin EC, spironolactone, SUMAtriptan, and potassium chloride SA.  Meds ordered this encounter  Medications   torsemide (DEMADEX) 20 MG tablet    Sig: Take 1 tablet (20 mg total) by mouth daily.    Dispense:  90 tablet  Refill:  0     Follow-up: Return in about 6 months (around 06/10/2022).  Sanda Linger, MD

## 2022-01-20 ENCOUNTER — Other Ambulatory Visit: Payer: Self-pay | Admitting: Internal Medicine

## 2022-01-20 DIAGNOSIS — R918 Other nonspecific abnormal finding of lung field: Secondary | ICD-10-CM | POA: Insufficient documentation

## 2022-01-24 ENCOUNTER — Encounter: Payer: Self-pay | Admitting: Internal Medicine

## 2022-01-24 DIAGNOSIS — I10 Essential (primary) hypertension: Secondary | ICD-10-CM

## 2022-01-24 DIAGNOSIS — E876 Hypokalemia: Secondary | ICD-10-CM

## 2022-01-25 MED ORDER — SPIRONOLACTONE 50 MG PO TABS: 50.0000 mg | ORAL_TABLET | Freq: Every day | ORAL | 0 refills | Status: DC

## 2022-01-26 ENCOUNTER — Other Ambulatory Visit: Payer: Self-pay | Admitting: Internal Medicine

## 2022-01-26 DIAGNOSIS — G43009 Migraine without aura, not intractable, without status migrainosus: Secondary | ICD-10-CM

## 2022-02-15 ENCOUNTER — Inpatient Hospital Stay: Admission: RE | Admit: 2022-02-15 | Payer: 59 | Source: Ambulatory Visit

## 2022-03-06 ENCOUNTER — Other Ambulatory Visit: Payer: Self-pay | Admitting: Internal Medicine

## 2022-03-06 DIAGNOSIS — I10 Essential (primary) hypertension: Secondary | ICD-10-CM

## 2022-03-12 ENCOUNTER — Other Ambulatory Visit: Payer: Self-pay

## 2022-03-12 DIAGNOSIS — R931 Abnormal findings on diagnostic imaging of heart and coronary circulation: Secondary | ICD-10-CM

## 2022-03-12 MED ORDER — ROSUVASTATIN CALCIUM 20 MG PO TABS: 20.0000 mg | ORAL_TABLET | Freq: Every day | ORAL | 3 refills | Status: DC

## 2022-04-16 ENCOUNTER — Other Ambulatory Visit: Payer: Self-pay | Admitting: Internal Medicine

## 2022-04-16 DIAGNOSIS — E876 Hypokalemia: Secondary | ICD-10-CM

## 2022-04-16 DIAGNOSIS — I10 Essential (primary) hypertension: Secondary | ICD-10-CM

## 2022-04-16 MED ORDER — SPIRONOLACTONE 50 MG PO TABS: 50.0000 mg | ORAL_TABLET | Freq: Every day | ORAL | 0 refills | Status: DC

## 2022-05-01 ENCOUNTER — Other Ambulatory Visit: Payer: Self-pay | Admitting: Internal Medicine

## 2022-05-01 DIAGNOSIS — G43009 Migraine without aura, not intractable, without status migrainosus: Secondary | ICD-10-CM

## 2022-05-23 ENCOUNTER — Encounter: Payer: Self-pay | Admitting: Internal Medicine

## 2022-05-23 DIAGNOSIS — I10 Essential (primary) hypertension: Secondary | ICD-10-CM

## 2022-05-23 MED ORDER — TORSEMIDE 20 MG PO TABS: 20.0000 mg | ORAL_TABLET | Freq: Every day | ORAL | 0 refills | Status: DC

## 2022-05-28 ENCOUNTER — Ambulatory Visit: Payer: 59 | Admitting: Cardiology

## 2022-06-11 ENCOUNTER — Ambulatory Visit: Payer: 59 | Admitting: Internal Medicine

## 2022-07-05 ENCOUNTER — Other Ambulatory Visit: Payer: Self-pay | Admitting: Internal Medicine

## 2022-07-05 DIAGNOSIS — G43009 Migraine without aura, not intractable, without status migrainosus: Secondary | ICD-10-CM

## 2022-07-06 ENCOUNTER — Other Ambulatory Visit: Payer: Self-pay | Admitting: Internal Medicine

## 2022-07-06 DIAGNOSIS — I10 Essential (primary) hypertension: Secondary | ICD-10-CM

## 2022-07-06 DIAGNOSIS — E876 Hypokalemia: Secondary | ICD-10-CM

## 2022-07-12 ENCOUNTER — Encounter: Payer: Self-pay | Admitting: Internal Medicine

## 2022-07-12 DIAGNOSIS — I10 Essential (primary) hypertension: Secondary | ICD-10-CM

## 2022-07-12 DIAGNOSIS — E876 Hypokalemia: Secondary | ICD-10-CM

## 2022-07-13 ENCOUNTER — Other Ambulatory Visit: Payer: Self-pay | Admitting: Internal Medicine

## 2022-07-13 DIAGNOSIS — E876 Hypokalemia: Secondary | ICD-10-CM

## 2022-07-13 DIAGNOSIS — I10 Essential (primary) hypertension: Secondary | ICD-10-CM

## 2022-07-13 MED ORDER — SPIRONOLACTONE 50 MG PO TABS: 50.0000 mg | ORAL_TABLET | Freq: Every day | ORAL | 0 refills | Status: DC

## 2022-07-23 ENCOUNTER — Ambulatory Visit (INDEPENDENT_AMBULATORY_CARE_PROVIDER_SITE_OTHER): Payer: 59

## 2022-07-23 ENCOUNTER — Ambulatory Visit: Payer: 59 | Admitting: Internal Medicine

## 2022-07-23 ENCOUNTER — Encounter: Payer: Self-pay | Admitting: Internal Medicine

## 2022-07-23 VITALS — BP 118/76 | HR 64 | Temp 98.1°F | Ht 62.0 in | Wt 129.0 lb

## 2022-07-23 DIAGNOSIS — E785 Hyperlipidemia, unspecified: Secondary | ICD-10-CM | POA: Diagnosis not present

## 2022-07-23 DIAGNOSIS — I10 Essential (primary) hypertension: Secondary | ICD-10-CM | POA: Diagnosis not present

## 2022-07-23 DIAGNOSIS — M542 Cervicalgia: Secondary | ICD-10-CM | POA: Diagnosis not present

## 2022-07-23 DIAGNOSIS — E7841 Elevated Lipoprotein(a): Secondary | ICD-10-CM

## 2022-07-23 DIAGNOSIS — E876 Hypokalemia: Secondary | ICD-10-CM | POA: Diagnosis not present

## 2022-07-23 DIAGNOSIS — T502X5A Adverse effect of carbonic-anhydrase inhibitors, benzothiadiazides and other diuretics, initial encounter: Secondary | ICD-10-CM

## 2022-07-23 DIAGNOSIS — M4802 Spinal stenosis, cervical region: Secondary | ICD-10-CM

## 2022-07-23 DIAGNOSIS — R911 Solitary pulmonary nodule: Secondary | ICD-10-CM | POA: Diagnosis not present

## 2022-07-23 DIAGNOSIS — Z0000 Encounter for general adult medical examination without abnormal findings: Secondary | ICD-10-CM | POA: Diagnosis not present

## 2022-07-23 DIAGNOSIS — R918 Other nonspecific abnormal finding of lung field: Secondary | ICD-10-CM | POA: Diagnosis not present

## 2022-07-23 DIAGNOSIS — Z1231 Encounter for screening mammogram for malignant neoplasm of breast: Secondary | ICD-10-CM

## 2022-07-23 LAB — CBC WITH DIFFERENTIAL/PLATELET
Basophils Absolute: 0.1 10*3/uL (ref 0.0–0.1)
Basophils Relative: 1.6 % (ref 0.0–3.0)
Eosinophils Absolute: 0.1 10*3/uL (ref 0.0–0.7)
Eosinophils Relative: 2.5 % (ref 0.0–5.0)
HCT: 41.1 % (ref 36.0–46.0)
Hemoglobin: 14.4 g/dL (ref 12.0–15.0)
Lymphocytes Relative: 39.3 % (ref 12.0–46.0)
Lymphs Abs: 1.7 10*3/uL (ref 0.7–4.0)
MCHC: 35.1 g/dL (ref 30.0–36.0)
MCV: 90.9 fl (ref 78.0–100.0)
Monocytes Absolute: 0.3 10*3/uL (ref 0.1–1.0)
Monocytes Relative: 7.7 % (ref 3.0–12.0)
Neutro Abs: 2.1 10*3/uL (ref 1.4–7.7)
Neutrophils Relative %: 48.9 % (ref 43.0–77.0)
Platelets: 237 10*3/uL (ref 150.0–400.0)
RBC: 4.52 Mil/uL (ref 3.87–5.11)
RDW: 12.5 % (ref 11.5–15.5)
WBC: 4.3 10*3/uL (ref 4.0–10.5)

## 2022-07-23 LAB — BASIC METABOLIC PANEL
BUN: 12 mg/dL (ref 6–23)
CO2: 29 mEq/L (ref 19–32)
Calcium: 9.6 mg/dL (ref 8.4–10.5)
Chloride: 102 mEq/L (ref 96–112)
Creatinine, Ser: 0.68 mg/dL (ref 0.40–1.20)
GFR: 104.02 mL/min (ref >60.00)
Glucose, Bld: 95 mg/dL (ref 70–99)
Potassium: 4 mEq/L (ref 3.5–5.1)
Sodium: 139 mEq/L (ref 135–145)

## 2022-07-23 LAB — LIPID PANEL
Cholesterol: 171 mg/dL (ref 0–200)
HDL: 77.2 mg/dL (ref >39.00)
LDL Cholesterol: 86 mg/dL (ref 0–99)
NonHDL: 94.14
Total CHOL/HDL Ratio: 2
Triglycerides: 40 mg/dL (ref 0.0–149.0)
VLDL: 8 mg/dL (ref 0.0–40.0)

## 2022-07-23 LAB — HEPATIC FUNCTION PANEL
ALT: 13 U/L (ref 0–35)
AST: 18 U/L (ref 0–37)
Albumin: 4.7 g/dL (ref 3.5–5.2)
Alkaline Phosphatase: 48 U/L (ref 39–117)
Bilirubin, Direct: 0.1 mg/dL (ref 0.0–0.3)
Total Bilirubin: 0.6 mg/dL (ref 0.2–1.2)
Total Protein: 7 g/dL (ref 6.0–8.3)

## 2022-07-23 LAB — TSH: TSH: 1.56 u[IU]/mL (ref 0.35–5.50)

## 2022-07-23 NOTE — Patient Instructions (Signed)

## 2022-07-23 NOTE — Progress Notes (Signed)
Subjective:  Patient ID: Katrina Bartlett, female    DOB: 08-Jun-1975  Age: 47 y.o. MRN: [id number]  CC: Annual Exam, Hypertension, and Hyperlipidemia   HPI Katrina Bartlett presents for a CPX and f/up ---  She is a runner and has good endurance.  She denies chest pain, shortness of breath, diaphoresis, or edema.  She has rare dizziness.  She complains of a 1 month history of nontraumatic neck pain that radiates towards both shoulders.  Outpatient Medications Prior to Visit  Medication Sig Dispense Refill   aspirin 81 MG EC tablet Take 1 tablet (81 mg total) by mouth daily. Swallow whole. 90 tablet 1   COLLAGEN PO Take by mouth.     losartan (COZAAR) 100 MG tablet TAKE 1 TABLET(100 MG) BY MOUTH DAILY 90 tablet 1   potassium chloride SA (KLOR-CON M) 20 MEQ tablet TAKE 1 TABLET BY MOUTH TWICE DAILY 180 tablet 1   Probiotic Product (PROBIOTIC-10 PO) Take by mouth.     rosuvastatin (CRESTOR) 20 MG tablet Take 1 tablet (20 mg total) by mouth daily. 90 tablet 3   spironolactone (ALDACTONE) 50 MG tablet TAKE 1 TABLET BY MOUTH DAILY 90 tablet 0   SUMAtriptan (IMITREX) 100 MG tablet TAKE 1 TABLET BY MOUTH EVERY 2 HOURS AS NEEDED FOR MIGRAINE. MAX OF 2 TABLETS PER DAY 10 tablet 2   torsemide (DEMADEX) 20 MG tablet Take 1 tablet (20 mg total) by mouth daily. 90 tablet 0   No facility-administered medications prior to visit.    ROS Review of Systems  Constitutional: Negative.  Negative for chills, diaphoresis and fatigue.  HENT: Negative.    Respiratory:  Positive for cough. Negative for chest tightness, shortness of breath and wheezing.   Cardiovascular:  Negative for chest pain, palpitations and leg swelling.  Gastrointestinal:  Negative for abdominal pain, constipation, diarrhea and nausea.  Endocrine: Negative.   Genitourinary: Negative.  Negative for difficulty urinating.  Musculoskeletal:  Positive for neck pain. Negative for arthralgias and back pain.  Skin: Negative.   Neurological:   Positive for dizziness. Negative for weakness and light-headedness.  Hematological:  Negative for adenopathy. Does not bruise/bleed easily.  Psychiatric/Behavioral: Negative.      Objective:  BP 118/76 (BP Location: Right Arm, Patient Position: Sitting, Cuff Size: Large)   Pulse 64   Temp 98.1 F (36.7 C) (Oral)   Ht 5\' 2"  (1.575 m)   Wt 129 lb (58.5 kg)   SpO2 99%   BMI 23.59 kg/m   BP Readings from Last 3 Encounters:  07/23/22 118/76  12/08/21 124/78  08/02/21 120/78    Wt Readings from Last 3 Encounters:  07/23/22 129 lb (58.5 kg)  12/08/21 129 lb (58.5 kg)  08/02/21 129 lb (58.5 kg)    Physical Exam Vitals reviewed.  HENT:     Nose: Nose normal.     Mouth/Throat:     Mouth: Mucous membranes are moist.  Eyes:     General: No scleral icterus.    Conjunctiva/sclera: Conjunctivae normal.  Cardiovascular:     Rate and Rhythm: Normal rate and regular rhythm.     Pulses:          Dorsalis pedis pulses are 1+ on the right side and 1+ on the left side.       Posterior tibial pulses are 1+ on the right side and 1+ on the left side.     Heart sounds: No murmur heard.    No gallop.  Pulmonary:  Effort: Pulmonary effort is normal.     Breath sounds: No stridor. No wheezing, rhonchi or rales.  Abdominal:     General: Abdomen is flat.     Palpations: There is no mass.     Tenderness: There is no abdominal tenderness. There is no guarding.     Hernia: No hernia is present.  Musculoskeletal:        General: Normal range of motion.     Cervical back: Neck supple.     Right lower leg: No edema.     Left lower leg: No edema.  Feet:     Right foot:     Skin integrity: Skin integrity normal.     Left foot:     Skin integrity: Skin integrity normal.  Lymphadenopathy:     Cervical: No cervical adenopathy.  Skin:    General: Skin is warm and dry.     Findings: No rash.  Neurological:     General: No focal deficit present.     Mental Status: She is alert.   Psychiatric:        Mood and Affect: Mood normal.        Behavior: Behavior normal.     Lab Results  Component Value Date   WBC 4.3 07/23/2022   HGB 14.4 07/23/2022   HCT 41.1 07/23/2022   PLT 237.0 07/23/2022   GLUCOSE 95 07/23/2022   CHOL 171 07/23/2022   TRIG 40.0 07/23/2022   HDL 77.20 07/23/2022   LDLCALC 86 07/23/2022   ALT 13 07/23/2022   AST 18 07/23/2022   NA 139 07/23/2022   K 4.0 07/23/2022   CL 102 07/23/2022   CREATININE 0.68 07/23/2022   BUN 12 07/23/2022   CO2 29 07/23/2022   TSH 1.56 07/23/2022    MM 3D SCREEN BREAST BILATERAL  Result Date: 05/26/2021 CLINICAL DATA:  Screening. EXAM: DIGITAL SCREENING BILATERAL MAMMOGRAM WITH TOMOSYNTHESIS AND CAD TECHNIQUE: Bilateral screening digital craniocaudal and mediolateral oblique mammograms were obtained. Bilateral screening digital breast tomosynthesis was performed. The images were evaluated with computer-aided detection. COMPARISON:  Previous exam(s). ACR Breast Density Category c: The breast tissue is heterogeneously dense, which may obscure small masses. FINDINGS: There are no findings suspicious for malignancy. IMPRESSION: No mammographic evidence of malignancy. A result letter of this screening mammogram will be mailed directly to the patient. RECOMMENDATION: Screening mammogram in one year. (Code:SM-B-01Y) BI-RADS CATEGORY  1: Negative. Electronically Signed   By: Annia Belt M.D.   On: 05/26/2021 08:31    DG Cervical Spine Complete  Result Date: 07/23/2022 CLINICAL DATA:  Neck pain for 1 month EXAM: CERVICAL SPINE - COMPLETE 4+ VIEW COMPARISON:  None Available. FINDINGS: There is no evidence of cervical spine fracture or prevertebral soft tissue swelling. Alignment is normal. Intervertebral disc heights are preserved. Mild facet and uncovertebral spurring contribute to bony foraminal crowding on the right at C3-4 and C4-5. Left-sided foramina appear widely patent. IMPRESSION: No acute findings. Mild facet and  uncovertebral spurring contribute to bony foraminal crowding on the right at C3-4 and C4-5. Electronically Signed   By: Duanne Guess D.O.   On: 07/23/2022 08:50     Assessment & Plan:   [name] was seen today for annual exam, hypertension and hyperlipidemia.  Diagnoses and all orders for this visit:  Multiple lung nodules on CT -     CT Chest Wo Contrast; Future  Nodule of middle lobe of right lung -     CT Chest Wo Contrast; Future  Essential hypertension- Her blood pressure is well-controlled. -     TSH; Future -     Basic metabolic panel; Future -     CBC with Differential/Platelet; Future -     CBC with Differential/Platelet -     Basic metabolic panel -     TSH  Hyperlipidemia LDL goal <70 - LDL goal achieved. Doing well on the statin  -     Lipid panel; Future -     TSH; Future -     Hepatic function panel; Future -     Lipoprotein A (LPA); Future -     Lipoprotein A (LPA) -     Hepatic function panel -     TSH -     Lipid panel  Elevated lipoprotein(a) -     Lipoprotein A (LPA); Future -     Lipoprotein A (LPA)  Diuretic-induced hypokalemia -     Basic metabolic panel; Future -     Basic metabolic panel  Routine general medical examination at a health care facility- Exam completed, labs reviewed, vaccines reviewed, cancer screenings addressed, patient education was given.  Abnormal findings on diagnostic imaging of lung -     Cancel: CT Chest Wo Contrast; Future -     CT Chest Wo Contrast; Future  Neck pain, bilateral -     DG Cervical Spine Complete; Future  Visit for screening mammogram -     MM DIGITAL SCREENING BILATERAL; Future  Foraminal stenosis of cervical region -     Ambulatory referral to Physical Medicine Rehab   I am having Katrina Bartlett maintain her COLLAGEN PO, Probiotic Product (PROBIOTIC-10 PO), losartan, aspirin EC, rosuvastatin, torsemide, SUMAtriptan, potassium chloride SA, and spironolactone.  No orders of the defined  types were placed in this encounter.    Follow-up: Return in about 6 months (around 01/23/2023).  Sanda Linger, MD

## 2022-07-24 ENCOUNTER — Encounter: Payer: Self-pay | Admitting: Internal Medicine

## 2022-07-27 ENCOUNTER — Other Ambulatory Visit: Payer: Self-pay | Admitting: Internal Medicine

## 2022-07-27 LAB — LIPOPROTEIN A (LPA): Lipoprotein (a): 218 nmol/L — ABNORMAL HIGH (ref <75)

## 2022-07-27 NOTE — Progress Notes (Signed)
Its tricky. These were my thoughts when I saw her a year ago:  Coronary calcification/coronary artery disease: Coronary calcium score 22, 98th percentile (02/2021) No obstructive CAD on CCTA (02/2021) Elevated lipoprotein (a) 186 Okay not to take Aspirin Recommend aggressive risk factor modification LDL down to 73 on Crestor to 20 mg. Given that her LDL/HDL ratio was good, and increased dose of statin may not necessarily help to reduce her LPA, I will continue Crestor 20 mg daily. In future, she could benefit from targeted therapy to reduce LP (a), when available

## 2022-07-27 NOTE — Progress Notes (Signed)
Only true treatment available for elevated Lp(a) is plasmapheresis. PCSK9i can reduce Lp(a) by 20-30%, but not targeted therapy for Lp(a) as such. There are several promising clinical trials for secondary prevention, which potentially could be extrapolated in future for primary prevention.   If LDL remains controlled <70, I would continue same dose statin without increasing it, as statins can actually increase Lp(a) level. If LDL >70 on statin, I would add PCSK9i. I will discuss with her more when I see her next month.  Hope that helps. Please feel free to reach out anytime.  Kierra Jezewski

## 2022-08-01 ENCOUNTER — Telehealth: Payer: Self-pay | Admitting: Physical Medicine and Rehabilitation

## 2022-08-01 NOTE — Telephone Encounter (Signed)
LVM to return call to clinic.

## 2022-08-01 NOTE — Telephone Encounter (Signed)
Patient called to cxl her appointment with [facility].

## 2022-08-02 ENCOUNTER — Ambulatory Visit: Payer: 59 | Admitting: Physical Medicine and Rehabilitation

## 2022-08-02 NOTE — Telephone Encounter (Signed)
LVM to inform patient that appointment was cancelled and to call back to reschedule

## 2022-08-06 ENCOUNTER — Other Ambulatory Visit: Payer: Self-pay | Admitting: Internal Medicine

## 2022-08-06 DIAGNOSIS — I10 Essential (primary) hypertension: Secondary | ICD-10-CM

## 2022-08-07 ENCOUNTER — Encounter: Payer: Self-pay | Admitting: Internal Medicine

## 2022-08-07 ENCOUNTER — Other Ambulatory Visit: Payer: Self-pay | Admitting: Internal Medicine

## 2022-08-07 DIAGNOSIS — I10 Essential (primary) hypertension: Secondary | ICD-10-CM

## 2022-08-07 MED ORDER — TORSEMIDE 20 MG PO TABS: 20.0000 mg | ORAL_TABLET | Freq: Every day | ORAL | 0 refills | Status: DC

## 2022-08-15 ENCOUNTER — Ambulatory Visit
Admission: RE | Admit: 2022-08-15 | Discharge: 2022-08-15 | Disposition: A | Discharge status: Home / Self Care | Payer: 59 | Source: Ambulatory Visit | Attending: Internal Medicine | Admitting: Internal Medicine

## 2022-08-15 ENCOUNTER — Other Ambulatory Visit: Payer: Self-pay | Admitting: Internal Medicine

## 2022-08-15 DIAGNOSIS — M4802 Spinal stenosis, cervical region: Secondary | ICD-10-CM

## 2022-08-15 DIAGNOSIS — E876 Hypokalemia: Secondary | ICD-10-CM

## 2022-08-15 DIAGNOSIS — I10 Essential (primary) hypertension: Secondary | ICD-10-CM

## 2022-08-15 DIAGNOSIS — Z0000 Encounter for general adult medical examination without abnormal findings: Secondary | ICD-10-CM

## 2022-08-15 DIAGNOSIS — Z1231 Encounter for screening mammogram for malignant neoplasm of breast: Secondary | ICD-10-CM

## 2022-08-15 DIAGNOSIS — M542 Cervicalgia: Secondary | ICD-10-CM

## 2022-08-15 DIAGNOSIS — R911 Solitary pulmonary nodule: Secondary | ICD-10-CM

## 2022-08-15 DIAGNOSIS — R918 Other nonspecific abnormal finding of lung field: Secondary | ICD-10-CM

## 2022-08-15 DIAGNOSIS — E785 Hyperlipidemia, unspecified: Secondary | ICD-10-CM

## 2022-08-15 DIAGNOSIS — E7841 Elevated Lipoprotein(a): Secondary | ICD-10-CM

## 2022-08-16 ENCOUNTER — Ambulatory Visit
Admission: RE | Admit: 2022-08-16 | Discharge: 2022-08-16 | Disposition: A | Discharge status: Home / Self Care | Payer: 59 | Source: Ambulatory Visit | Attending: Internal Medicine | Admitting: Internal Medicine

## 2022-08-16 DIAGNOSIS — R911 Solitary pulmonary nodule: Secondary | ICD-10-CM

## 2022-08-16 DIAGNOSIS — R918 Other nonspecific abnormal finding of lung field: Secondary | ICD-10-CM

## 2022-08-17 ENCOUNTER — Other Ambulatory Visit: Payer: Self-pay | Admitting: Internal Medicine

## 2022-08-17 DIAGNOSIS — R928 Other abnormal and inconclusive findings on diagnostic imaging of breast: Secondary | ICD-10-CM

## 2022-08-20 ENCOUNTER — Encounter: Payer: Self-pay | Admitting: Internal Medicine

## 2022-08-20 ENCOUNTER — Other Ambulatory Visit: Payer: Self-pay | Admitting: Internal Medicine

## 2022-08-20 DIAGNOSIS — R918 Other nonspecific abnormal finding of lung field: Secondary | ICD-10-CM

## 2022-08-28 ENCOUNTER — Ambulatory Visit: Payer: 59

## 2022-08-28 ENCOUNTER — Ambulatory Visit
Admission: RE | Admit: 2022-08-28 | Discharge: 2022-08-28 | Disposition: A | Discharge status: Home / Self Care | Payer: 59 | Source: Ambulatory Visit | Attending: Internal Medicine | Admitting: Internal Medicine

## 2022-08-28 DIAGNOSIS — R928 Other abnormal and inconclusive findings on diagnostic imaging of breast: Secondary | ICD-10-CM

## 2022-08-31 ENCOUNTER — Encounter: Payer: Self-pay | Admitting: Cardiology

## 2022-08-31 ENCOUNTER — Ambulatory Visit: Payer: 59 | Admitting: Cardiology

## 2022-08-31 VITALS — BP 133/86 | HR 58 | Ht 62.0 in | Wt 132.0 lb

## 2022-08-31 DIAGNOSIS — R931 Abnormal findings on diagnostic imaging of heart and coronary circulation: Secondary | ICD-10-CM

## 2022-08-31 DIAGNOSIS — E782 Mixed hyperlipidemia: Secondary | ICD-10-CM

## 2022-08-31 MED ORDER — EZETIMIBE 10 MG PO TABS: 10.0000 mg | ORAL_TABLET | Freq: Every day | ORAL | 3 refills | Status: DC

## 2022-08-31 MED ORDER — ROSUVASTATIN CALCIUM 10 MG PO TABS: 10.0000 mg | ORAL_TABLET | Freq: Every day | ORAL | 3 refills | Status: DC

## 2022-08-31 NOTE — Progress Notes (Signed)
Patient referred by Etta Grandchild, MD for coronary calcification  Subjective:   Katrina Bartlett, female    DOB: [birth year], 47 y.o.   MRN: [id number]   Chief Complaint  Patient presents with   Elevated coronary artery calcium score   Follow-up     HPI  47 year old female with hypertension, former smoker, family history of early CAD, referred for admission clinical history patient seen on CT scan.    Patient's father passed in 05/2022 with pancreatic cancer.  Patient and family are still grieving from it.  In the last few months, she has actually not been able to do much in terms of exercise.  Reviewed recent test results with the patient, details below.   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  However, patient is very active, runs regularly.  She ran 7 miles this morning and 1 hour 10 minutes.  She is currently training for half marathon.  She runs two half marathons every year.  Her last half marathon was in February 2022 in [place] where she clocked just under 10-minute a mile.  On further specific questioning, she does report some exertional dyspnea.  Her running speed used to be about 9-minute a mile 3 years ago.  During early days of COVID, she did not run any half marathon. She has noticed gradual decline in her speed, now about 10-minute mile.  On specific questioning, she reports some "leg fatigue" while running, more than before.  She does report right-sided chest pain that does sometimes occur during running, and resolves with rest.  That said, she has had similar chest pains for several years ever since she lost her brother about 20 years ago (not due to cardiac cause).   Patient used to smoke about half to 1 pack a day, smoked for close to 12 years.  She has now been quit for 14 years.  Patient's mother had hyperlipidemia and hypertension.  Her maternal grandfather had a massive MI at age 38.    Recently, she  underwent chest x-ray and CT scan due to complaints of chronic cough.  In addition to some pulmonary nodules, this showed advanced aorta and coronary artery calcification for her age.  She was thus referred to me.   Current Outpatient Medications:    aspirin 81 MG EC tablet, Take 1 tablet (81 mg total) by mouth daily. Swallow whole., Disp: 90 tablet, Rfl: 1   COLLAGEN PO, Take by mouth., Disp: , Rfl:    losartan (COZAAR) 100 MG tablet, TAKE 1 TABLET BY MOUTH DAILY, Disp: 90 tablet, Rfl: 0   potassium chloride SA (KLOR-CON M) 20 MEQ tablet, TAKE 1 TABLET BY MOUTH TWICE DAILY, Disp: 180 tablet, Rfl: 1   Probiotic Product (PROBIOTIC-10 PO), Take by mouth., Disp: , Rfl:    rosuvastatin (CRESTOR) 20 MG tablet, Take 1 tablet (20 mg total) by mouth daily., Disp: 90 tablet, Rfl: 3   spironolactone (ALDACTONE) 50 MG tablet, TAKE 1 TABLET BY MOUTH DAILY, Disp: 90 tablet, Rfl: 0   SUMAtriptan (IMITREX) 100 MG tablet, TAKE 1 TABLET BY MOUTH EVERY 2 HOURS AS NEEDED FOR MIGRAINE. MAX OF 2 TABLETS PER DAY, Disp: 10 tablet, Rfl: 2   torsemide (DEMADEX) 20 MG tablet, Take 1 tablet (20 mg total) by mouth daily., Disp: 90 tablet, Rfl: 0    Cardiovascular and other pertinent studies:  EKG 08/31/2022: Sinus rhythm 56 bpm Normal EKG  Coronary CTA 02/22/2021:  1. Total coronary calcium score of 22.9. This was 98th percentile for age and sex matched control. 2. Normal coronary origin with right dominance. 3. CAD-RADS = 2.   LM: Patent. LAD: Mild stenosis (25-49%) proximal/mid LAD due to calcified plaque, Mid to apical LAD is patent. LCX: Patent. RCA: Minimal stenosis (0-24%) within the proximal RCA due to calcified plaque otherwise no evidence of plaque or stenosis.   4.  Aortic atherosclerosis.   RECOMMENDATIONS:   Mild non-obstructive CAD (25-49%). Consider non-atherosclerotic causes of chest pain. Consider preventive therapy and risk factor modification.     Echocardiogram 02/28/2021:  Left  ventricle cavity is normal in size and wall thickness. Normal global  wall motion. Normal LV systolic function with EF 56%. Normal diastolic  filling pattern.  Left atrial cavity is mildly dilated. Thin interatrial septum with no 2D  or Doppler evidence of patent foramen ovale.  Mild (Grade I) mitral regurgitation.  Mild tricuspid regurgitation.  No evidence of pulmonary hypertension.  Lower Extremity Arterial Duplex 02/28/2021:  No hemodynamically significant stenoses are identified in the right or  left  lower extremity arterial system.  There is normal triphasic waveform  pattern throughout the lower extremity.  This exam reveals mildly decreased perfusion of the right lower extremity,  noted at the dorsalis pedis and post tibial artery level (ABI 0.96) and  mildly decreased perfusion of the left lower extremity, noted at the  dorsalis pedis and post tibial artery level (ABI 0.88).  Consider evaluation for pseudo claudication.  CT chest 01/20/2021: Scattered atherosclerotic calcifications including advanced coronary arterial calcification for age.   Volume loss/atelectasis in RIGHT middle lobe with mild bronchiectasis and scattered mucous plugging, likely post inflammatory.   Scattered BILATERAL pulmonary nodules up to 5 mm diameter; no follow-up needed if patient is low-risk (and has no known or suspected primary neoplasm). Non-contrast chest CT can be considered in 12 months if patient is high-risk. This recommendation follows the consensus statement: Guidelines for Management of Incidental Pulmonary Nodules Detected on CT Images: From the Fleischner Society 2017; Radiology 2017; 284:228-243.    Recent labs: 07/23/2022: Glucose 95, BUN/Cr 12/0/68. EGFR 104. Na/K 139/4.0. Rest of the CMP normal Chol 171, TG 40, HDL 77, LDL 86 Lipoprotein (a) 218 High  05/04/2021: Chol 163, TG 65, HDL 67, LDL 73  02/20/2021: Lipoprotein (a) 186.6 High  01/10/2021: Glucose 92, BUN/Cr  11/0.66. EGFR 105. Na/K 134/3.9. Rest of the CMP normal H/H 13/39. MCV 91. Platelets 223 HbA1C N/A Chol 180, TG 60, HDL 64, LDL 104 TSH N/A Creatinine/aldosterone ratio normal.   Renin activity 10 (<5.8)   Review of Systems  Cardiovascular:  Positive for chest pain, claudication and dyspnea on exertion. Negative for leg swelling, palpitations and syncope.         Vitals:   08/31/22 0840  BP: 133/86  Pulse: (!) 58  SpO2: 100%     Body mass index is 24.14 kg/m. Filed Weights   08/31/22 0840  Weight: 132 lb (59.9 kg)     Objective:   Physical Exam Vitals and nursing note reviewed.  Constitutional:      General: She is not in acute distress. Neck:     Vascular: No JVD.  Cardiovascular:     Rate and Rhythm: Normal rate and regular rhythm.     Pulses:          Dorsalis pedis pulses are 0 on the right side and 2+ on the left side.       Posterior  tibial pulses are 2+ on the right side and 2+ on the left side.     Heart sounds: Normal heart sounds. No murmur heard. Pulmonary:     Effort: Pulmonary effort is normal.     Breath sounds: Normal breath sounds. No wheezing or rales.  Musculoskeletal:     Right lower leg: No edema.     Left lower leg: No edema.         Assessment & Recommendations:    47 year old female with hypertension, former smoker, elevated coronary calcium score, elevated lipoprotein (a), family history of early CAD  Coronary calcification/coronary artery disease: Coronary calcium score 22, 98th percentile (02/2021) No obstructive CAD on CCTA (02/2021) Elevated lipoprotein (a) 186 Okay not to take Aspirin Recommend aggressive risk factor modification Chol 171, TG 40, HDL 77, LDL 86 (07/2022) on Crestor to 20 mg. lipoprotein (a) 218 (slightly higher after being on high intensity statin).  I recommend reducing Crestor dose down to 10 mg daily to avoid further lipoprotein (a) elevation.  I talked to her about Repatha injections, but she is not  interested.  I will add Zetia 10 mg daily.  Repeat lipid panel in 3 months.  I would not repeat her lipoprotein a, as I do not expect it to significantly change.  However, goal would be to keep LDL at least <100.  While exercises does not help lipoprotein a, it will absolutely help reduce every other cardiac risk factor.  Therefore, I do encourage her to start exercising again.  Hypertension: Controlled  Hyperlipidemia: As above  Lipid panel and f/u in 3 months    Elder Negus, MD Pager: [phone] Office: [phone]

## 2022-09-27 NOTE — Progress Notes (Deleted)
Synopsis: Referred for multiple pulmonary nodules by Etta Grandchild, MD  Subjective:   PATIENT ID: Katrina Bartlett GENDER: female DOB: [birth year], MRN: [id number]  No chief complaint on file.  47yF with history of HTN, CAD, GERD, HLD, 13 py smoking quit 2008 referred for multiple mpulmonary nodules, nodular bronchiectasis    Otherwise pertinent review of systems is negative.  Past Medical History:  Diagnosis Date   Depression    Generalized headaches    GERD (gastroesophageal reflux disease)    Hyperlipidemia    Hypertension      Family History  Problem Relation Age of Onset   Hearing loss Father    Hyperlipidemia Father    Hypertension Father    Pancreatic cancer Father    Early death Brother        SUICIDE   Arthritis Maternal Grandmother    Cancer Maternal Grandmother    Arthritis Maternal Grandfather    Heart disease Maternal Grandfather    Hyperlipidemia Maternal Grandfather    Hypertension Maternal Grandfather    Kidney disease Maternal Grandfather    Arthritis Paternal Grandmother    Arthritis Paternal Grandfather    Cancer Paternal Grandfather    Colon cancer Neg Hx    Esophageal cancer Neg Hx      Past Surgical History:  Procedure Laterality Date   ABDOMINAL HYSTERECTOMY     partial   APPENDECTOMY     COLONOSCOPY     right hand surgery      Social History   Socioeconomic History   Marital status: Married    Spouse name: Not on file   Number of children: 1   Years of education: Not on file   Highest education level: Not on file  Occupational History   Not on file  Tobacco Use   Smoking status: Former    Packs/day: 1.00    Years: 13.00    Additional pack years: 0.00    Total pack years: 13.00    Types: Cigarettes    Quit date: 2008    Years since quitting: 16.3    Passive exposure: Never   Smokeless tobacco: Never  Vaping Use   Vaping Use: Never used  Substance and Sexual Activity   Alcohol use: Yes    Alcohol/week: 5.0 standard  drinks of alcohol    Types: 5 Glasses of wine per week   Drug use: Never   Sexual activity: Yes    Partners: Male    Birth control/protection: Surgical  Other Topics Concern   Not on file  Social History Narrative   Not on file   Social Determinants of Health   Financial Resource Strain: Not on file  Food Insecurity: Not on file  Transportation Needs: Not on file  Physical Activity: Not on file  Stress: Not on file  Social Connections: Not on file  Intimate Partner Violence: Not on file     Allergies  Allergen Reactions   Penicillins Hives     Outpatient Medications Prior to Visit  Medication Sig Dispense Refill   aspirin 81 MG EC tablet Take 1 tablet (81 mg total) by mouth daily. Swallow whole. 90 tablet 1   ezetimibe (ZETIA) 10 MG tablet Take 1 tablet (10 mg total) by mouth daily. 90 tablet 3   losartan (COZAAR) 100 MG tablet TAKE 1 TABLET BY MOUTH DAILY 90 tablet 0   potassium chloride SA (KLOR-CON M) 20 MEQ tablet TAKE 1 TABLET BY MOUTH TWICE DAILY 180 tablet 1   Probiotic  Product (PROBIOTIC-10 PO) Take by mouth.     rosuvastatin (CRESTOR) 10 MG tablet Take 1 tablet (10 mg total) by mouth daily. 90 tablet 3   spironolactone (ALDACTONE) 50 MG tablet TAKE 1 TABLET BY MOUTH DAILY 90 tablet 0   SUMAtriptan (IMITREX) 100 MG tablet TAKE 1 TABLET BY MOUTH EVERY 2 HOURS AS NEEDED FOR MIGRAINE. MAX OF 2 TABLETS PER DAY 10 tablet 2   torsemide (DEMADEX) 20 MG tablet Take 1 tablet (20 mg total) by mouth daily. 90 tablet 0   No facility-administered medications prior to visit.       Objective:   Physical Exam:  General appearance: 47 y.o., female, NAD, conversant  Eyes: anicteric sclerae; PERRL, tracking appropriately HENT: NCAT; MMM Neck: Trachea midline; no lymphadenopathy, no JVD Lungs: CTAB, no crackles, no wheeze, with normal respiratory effort CV: RRR, no murmur  Abdomen: Soft, non-tender; non-distended, BS present  Extremities: No peripheral edema, warm Skin:  Normal turgor and texture; no rash Psych: Appropriate affect Neuro: Alert and oriented to person and place, no focal deficit     There were no vitals filed for this visit.   on *** LPM *** RA BMI Readings from Last 3 Encounters:  08/31/22 24.14 kg/m  07/23/22 23.59 kg/m  12/08/21 23.59 kg/m   Wt Readings from Last 3 Encounters:  08/31/22 132 lb (59.9 kg)  07/23/22 129 lb (58.5 kg)  12/08/21 129 lb (58.5 kg)     CBC    Component Value Date/Time   WBC 4.3 07/23/2022 0837   RBC 4.52 07/23/2022 0837   HGB 14.4 07/23/2022 0837   HCT 41.1 07/23/2022 0837   PLT 237.0 07/23/2022 0837   MCV 90.9 07/23/2022 0837   MCHC 35.1 07/23/2022 0837   RDW 12.5 07/23/2022 0837   LYMPHSABS 1.7 07/23/2022 0837   MONOABS 0.3 07/23/2022 0837   EOSABS 0.1 07/23/2022 0837   BASOSABS 0.1 07/23/2022 0837     Chest Imaging: CT CHest 08/19/22 with multiple small nodules, RML/lingula predominant bronchiectasis  Pulmonary Functions Testing Results:     No data to display          FeNO: ***  Pathology: ***  Echocardiogram:   Echocardiogram 02/28/2021: Left ventricle cavity is normal in size and wall thickness. Normal global wall motion. Normal LV systolic function with EF 56%. Normal diastolic filling pattern. Left atrial cavity is mildly dilated. Thin interatrial septum with no 2D or Doppler evidence of patent foramen ovale. Mild (Grade I) mitral regurgitation. Mild tricuspid regurgitation. No evidence of pulmonary hypertension.    Heart Catheterization: ***    Assessment & Plan:    Plan:      Omar Person, MD [facility] 09/27/2022 10:04 AM

## 2022-10-01 ENCOUNTER — Institutional Professional Consult (permissible substitution): Payer: 59 | Admitting: Student

## 2022-10-05 ENCOUNTER — Encounter: Payer: Self-pay | Admitting: Internal Medicine

## 2022-10-05 ENCOUNTER — Other Ambulatory Visit: Payer: Self-pay | Admitting: Internal Medicine

## 2022-10-05 DIAGNOSIS — I10 Essential (primary) hypertension: Secondary | ICD-10-CM

## 2022-10-05 DIAGNOSIS — E876 Hypokalemia: Secondary | ICD-10-CM

## 2022-10-05 MED ORDER — SPIRONOLACTONE 50 MG PO TABS: 50.0000 mg | ORAL_TABLET | Freq: Every day | ORAL | 1 refills | Status: DC

## 2022-10-17 NOTE — Progress Notes (Unsigned)
Synopsis: Referred for multiple pulmonary nodules by Etta Grandchild, MD  Subjective:   PATIENT ID: Katrina Bartlett GENDER: female DOB: [birth year], MRN: [id number]  No chief complaint on file.  47yF with history of GERD, HTN, HLD referred for multiple pulmonary nodules  Otherwise pertinent review of systems is negative.  Past Medical History:  Diagnosis Date   Depression    Generalized headaches    GERD (gastroesophageal reflux disease)    Hyperlipidemia    Hypertension      Family History  Problem Relation Age of Onset   Hearing loss Father    Hyperlipidemia Father    Hypertension Father    Pancreatic cancer Father    Early death Brother        SUICIDE   Arthritis Maternal Grandmother    Cancer Maternal Grandmother    Arthritis Maternal Grandfather    Heart disease Maternal Grandfather    Hyperlipidemia Maternal Grandfather    Hypertension Maternal Grandfather    Kidney disease Maternal Grandfather    Arthritis Paternal Grandmother    Arthritis Paternal Grandfather    Cancer Paternal Grandfather    Colon cancer Neg Hx    Esophageal cancer Neg Hx      Past Surgical History:  Procedure Laterality Date   ABDOMINAL HYSTERECTOMY     partial   APPENDECTOMY     COLONOSCOPY     right hand surgery      Social History   Socioeconomic History   Marital status: Married    Spouse name: Not on file   Number of children: 1   Years of education: Not on file   Highest education level: Not on file  Occupational History   Not on file  Tobacco Use   Smoking status: Former    Packs/day: 1.00    Years: 13.00    Additional pack years: 0.00    Total pack years: 13.00    Types: Cigarettes    Quit date: 2008    Years since quitting: 16.4    Passive exposure: Never   Smokeless tobacco: Never  Vaping Use   Vaping Use: Never used  Substance and Sexual Activity   Alcohol use: Yes    Alcohol/week: 5.0 standard drinks of alcohol    Types: 5 Glasses of wine per week    Drug use: Never   Sexual activity: Yes    Partners: Male    Birth control/protection: Surgical  Other Topics Concern   Not on file  Social History Narrative   Not on file   Social Determinants of Health   Financial Resource Strain: Not on file  Food Insecurity: Not on file  Transportation Needs: Not on file  Physical Activity: Not on file  Stress: Not on file  Social Connections: Not on file  Intimate Partner Violence: Not on file     Allergies  Allergen Reactions   Penicillins Hives     Outpatient Medications Prior to Visit  Medication Sig Dispense Refill   aspirin 81 MG EC tablet Take 1 tablet (81 mg total) by mouth daily. Swallow whole. 90 tablet 1   ezetimibe (ZETIA) 10 MG tablet Take 1 tablet (10 mg total) by mouth daily. 90 tablet 3   losartan (COZAAR) 100 MG tablet TAKE 1 TABLET BY MOUTH DAILY 90 tablet 0   potassium chloride SA (KLOR-CON M) 20 MEQ tablet TAKE 1 TABLET BY MOUTH TWICE DAILY 180 tablet 1   Probiotic Product (PROBIOTIC-10 PO) Take by mouth.  rosuvastatin (CRESTOR) 10 MG tablet Take 1 tablet (10 mg total) by mouth daily. 90 tablet 3   spironolactone (ALDACTONE) 50 MG tablet Take 1 tablet (50 mg total) by mouth daily. 90 tablet 1   SUMAtriptan (IMITREX) 100 MG tablet TAKE 1 TABLET BY MOUTH EVERY 2 HOURS AS NEEDED FOR MIGRAINE. MAX OF 2 TABLETS PER DAY 10 tablet 2   torsemide (DEMADEX) 20 MG tablet Take 1 tablet (20 mg total) by mouth daily. 90 tablet 0   No facility-administered medications prior to visit.       Objective:   Physical Exam:  General appearance: 47 y.o., female, NAD, conversant  Eyes: anicteric sclerae; PERRL, tracking appropriately HENT: NCAT; MMM Neck: Trachea midline; no lymphadenopathy, no JVD Lungs: CTAB, no crackles, no wheeze, with normal respiratory effort CV: RRR, no murmur  Abdomen: Soft, non-tender; non-distended, BS present  Extremities: No peripheral edema, warm Skin: Normal turgor and texture; no rash Psych:  Appropriate affect Neuro: Alert and oriented to person and place, no focal deficit     There were no vitals filed for this visit.   on *** LPM *** RA BMI Readings from Last 3 Encounters:  08/31/22 24.14 kg/m  07/23/22 23.59 kg/m  12/08/21 23.59 kg/m   Wt Readings from Last 3 Encounters:  08/31/22 132 lb (59.9 kg)  07/23/22 129 lb (58.5 kg)  12/08/21 129 lb (58.5 kg)     CBC    Component Value Date/Time   WBC 4.3 07/23/2022 0837   RBC 4.52 07/23/2022 0837   HGB 14.4 07/23/2022 0837   HCT 41.1 07/23/2022 0837   PLT 237.0 07/23/2022 0837   MCV 90.9 07/23/2022 0837   MCHC 35.1 07/23/2022 0837   RDW 12.5 07/23/2022 0837   LYMPHSABS 1.7 07/23/2022 0837   MONOABS 0.3 07/23/2022 0837   EOSABS 0.1 07/23/2022 0837   BASOSABS 0.1 07/23/2022 0837    ***  Chest Imaging: ***  Pulmonary Functions Testing Results:     No data to display          FeNO: ***  Pathology: ***  Echocardiogram: ***  Heart Catheterization: ***    Assessment & Plan:    Plan:      Omar Person, MD [facility] 10/17/2022 7:06 PM

## 2022-10-18 ENCOUNTER — Ambulatory Visit: Payer: 59 | Admitting: Student

## 2022-10-18 ENCOUNTER — Encounter: Payer: Self-pay | Admitting: Student

## 2022-10-18 VITALS — BP 110/76 | HR 68 | Ht 62.0 in | Wt 129.8 lb

## 2022-10-18 DIAGNOSIS — J219 Acute bronchiolitis, unspecified: Secondary | ICD-10-CM | POA: Diagnosis not present

## 2022-10-18 DIAGNOSIS — R053 Chronic cough: Secondary | ICD-10-CM | POA: Diagnosis not present

## 2022-10-18 MED ORDER — OMEPRAZOLE 40 MG PO CPDR: 40.0000 mg | DELAYED_RELEASE_CAPSULE | Freq: Every day | ORAL | 0 refills | Status: AC

## 2022-10-18 NOTE — Patient Instructions (Addendum)
-   sputum cultures - stop pepcid and start omeprazole 40 mg 30 minutes before breakfast (or any meal) everyday until next visit

## 2022-10-29 ENCOUNTER — Other Ambulatory Visit: Payer: Self-pay | Admitting: Internal Medicine

## 2022-10-29 ENCOUNTER — Encounter: Payer: Self-pay | Admitting: Internal Medicine

## 2022-10-29 DIAGNOSIS — G43009 Migraine without aura, not intractable, without status migrainosus: Secondary | ICD-10-CM

## 2022-10-29 DIAGNOSIS — E876 Hypokalemia: Secondary | ICD-10-CM

## 2022-10-29 DIAGNOSIS — I10 Essential (primary) hypertension: Secondary | ICD-10-CM

## 2022-10-29 MED ORDER — SUMATRIPTAN SUCCINATE 100 MG PO TABS: ORAL_TABLET | ORAL | 2 refills | Status: DC

## 2022-10-29 MED ORDER — TORSEMIDE 20 MG PO TABS: 20.0000 mg | ORAL_TABLET | Freq: Every day | ORAL | 0 refills | Status: DC

## 2022-10-29 MED ORDER — SPIRONOLACTONE 50 MG PO TABS: 50.0000 mg | ORAL_TABLET | Freq: Every day | ORAL | 0 refills | Status: DC

## 2022-10-29 MED ORDER — LOSARTAN POTASSIUM 100 MG PO TABS: 100.0000 mg | ORAL_TABLET | Freq: Every day | ORAL | 0 refills | Status: DC

## 2022-12-04 LAB — LIPID PANEL
Chol/HDL Ratio: 1.8 ratio (ref 0.0–4.4)
Cholesterol, Total: 134 mg/dL (ref 100–199)
HDL: 74 mg/dL (ref >39)
LDL Chol Calc (NIH): 50 mg/dL (ref 0–99)
Triglycerides: 42 mg/dL (ref 0–149)
VLDL Cholesterol Cal: 10 mg/dL (ref 5–40)

## 2022-12-10 ENCOUNTER — Ambulatory Visit: Payer: 59 | Admitting: Cardiology

## 2022-12-10 ENCOUNTER — Encounter: Payer: Self-pay | Admitting: Cardiology

## 2022-12-10 VITALS — BP 118/79 | HR 62 | Resp 16 | Ht 62.0 in | Wt 127.6 lb

## 2022-12-10 DIAGNOSIS — I10 Essential (primary) hypertension: Secondary | ICD-10-CM

## 2022-12-10 DIAGNOSIS — R931 Abnormal findings on diagnostic imaging of heart and coronary circulation: Secondary | ICD-10-CM

## 2022-12-10 DIAGNOSIS — E7841 Elevated Lipoprotein(a): Secondary | ICD-10-CM

## 2022-12-10 DIAGNOSIS — E782 Mixed hyperlipidemia: Secondary | ICD-10-CM

## 2022-12-10 MED ORDER — HYDROCHLOROTHIAZIDE 25 MG PO TABS: 25.0000 mg | ORAL_TABLET | Freq: Every day | ORAL | 3 refills | Status: DC

## 2022-12-10 NOTE — Progress Notes (Signed)
Patient referred by Etta Grandchild, MD for coronary calcification  Subjective:   Katrina Bartlett, female    DOB: 1975-11-29, 47 y.o.   MRN: [id number]   Chief Complaint  Patient presents with   Elevated coronary artery calcium score   Hyperlipidemia   Follow-up     HPI  47 year old female with hypertension, former smoker, family history of early CAD, referred for admission clinical history patient seen on CT scan.    Patient is doing well. She denies chest pain, shortness of breath, palpitations, leg edema, orthopnea, PND, TIA/syncope. She has seen some leg swelling lately. She thinks hydrochlorothiazide worked better for leg swelling before. Reviewed recent test results with the patient, details below.   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  However, patient is very active, runs regularly.  She ran 7 miles this morning and 1 hour 10 minutes.  She is currently training for half marathon.  She runs two half marathons every year.  Her last half marathon was in February 2022 in [place] where she clocked just under 10-minute a mile.  On further specific questioning, she does report some exertional dyspnea.  Her running speed used to be about 9-minute a mile 3 years ago.  During early days of COVID, she did not run any half marathon. She has noticed gradual decline in her speed, now about 10-minute mile.  On specific questioning, she reports some "leg fatigue" while running, more than before.  She does report right-sided chest pain that does sometimes occur during running, and resolves with rest.  That said, she has had similar chest pains for several years ever since she lost her brother about 20 years ago (not due to cardiac cause).   Patient used to smoke about half to 1 pack a day, smoked for close to 12 years.  She has now been quit for 14 years.  Patient's mother had hyperlipidemia and hypertension.  Her maternal  grandfather had a massive MI at age 31.    Recently, she underwent chest x-ray and CT scan due to complaints of chronic cough.  In addition to some pulmonary nodules, this showed advanced aorta and coronary artery calcification for her age.  She was thus referred to me.   Current Outpatient Medications:    aspirin 81 MG EC tablet, Take 1 tablet (81 mg total) by mouth daily. Swallow whole., Disp: 90 tablet, Rfl: 1   Bioflavonoid Products (SUPER-C 1000 PO), Take by mouth., Disp: , Rfl:    ezetimibe (ZETIA) 10 MG tablet, Take 1 tablet (10 mg total) by mouth daily., Disp: 90 tablet, Rfl: 3   losartan (COZAAR) 100 MG tablet, Take 1 tablet (100 mg total) by mouth daily., Disp: 90 tablet, Rfl: 0   omeprazole (PRILOSEC) 40 MG capsule, Take 1 capsule (40 mg total) by mouth daily., Disp: 90 capsule, Rfl: 0   potassium chloride SA (KLOR-CON M) 20 MEQ tablet, TAKE 1 TABLET BY MOUTH TWICE DAILY, Disp: 180 tablet, Rfl: 1   Probiotic Product (PROBIOTIC-10 PO), Take by mouth., Disp: , Rfl:    rosuvastatin (CRESTOR) 10 MG tablet, Take 1 tablet (10 mg total) by mouth daily., Disp: 90 tablet, Rfl: 3   spironolactone (ALDACTONE) 50 MG tablet, Take 1 tablet (50 mg total) by mouth daily., Disp: 90 tablet, Rfl: 0   SUMAtriptan (IMITREX) 100 MG tablet, TAKE 1 TABLET BY MOUTH EVERY 2 HOURS AS NEEDED FOR MIGRAINE. MAX OF 2  TABLETS PER DAY, Disp: 10 tablet, Rfl: 2   torsemide (DEMADEX) 20 MG tablet, Take 1 tablet (20 mg total) by mouth daily., Disp: 90 tablet, Rfl: 0    Cardiovascular and other pertinent studies:  EKG 08/31/2022: Sinus rhythm 56 bpm Normal EKG  Coronary CTA 02/22/2021: 1. Total coronary calcium score of 22.9. This was 98th percentile for age and sex matched control. 2. Normal coronary origin with right dominance. 3. CAD-RADS = 2.   LM: Patent. LAD: Mild stenosis (25-49%) proximal/mid LAD due to calcified plaque, Mid to apical LAD is patent. LCX: Patent. RCA: Minimal stenosis (0-24%) within  the proximal RCA due to calcified plaque otherwise no evidence of plaque or stenosis.   4.  Aortic atherosclerosis.   RECOMMENDATIONS:   Mild non-obstructive CAD (25-49%). Consider non-atherosclerotic causes of chest pain. Consider preventive therapy and risk factor modification.     Echocardiogram 02/28/2021:  Left ventricle cavity is normal in size and wall thickness. Normal global  wall motion. Normal LV systolic function with EF 56%. Normal diastolic  filling pattern.  Left atrial cavity is mildly dilated. Thin interatrial septum with no 2D  or Doppler evidence of patent foramen ovale.  Mild (Grade I) mitral regurgitation.  Mild tricuspid regurgitation.  No evidence of pulmonary hypertension.  Lower Extremity Arterial Duplex 02/28/2021:  No hemodynamically significant stenoses are identified in the right or  left  lower extremity arterial system.  There is normal triphasic waveform  pattern throughout the lower extremity.  This exam reveals mildly decreased perfusion of the right lower extremity,  noted at the dorsalis pedis and post tibial artery level (ABI 0.96) and  mildly decreased perfusion of the left lower extremity, noted at the  dorsalis pedis and post tibial artery level (ABI 0.88).  Consider evaluation for pseudo claudication.  CT chest 01/20/2021: Scattered atherosclerotic calcifications including advanced coronary arterial calcification for age.   Volume loss/atelectasis in RIGHT middle lobe with mild bronchiectasis and scattered mucous plugging, likely post inflammatory.   Scattered BILATERAL pulmonary nodules up to 5 mm diameter; no follow-up needed if patient is low-risk (and has no known or suspected primary neoplasm). Non-contrast chest CT can be considered in 12 months if patient is high-risk. This recommendation follows the consensus statement: Guidelines for Management of Incidental Pulmonary Nodules Detected on CT Images: From the Fleischner  Society 2017; Radiology 2017; 284:228-243.    Recent labs: 11/23/2022: Chol 134, TG 42, HDL 74, LDL 50  07/23/2022: Glucose 95, BUN/Cr 12/0/68. EGFR 104. Na/K 139/4.0. Rest of the CMP normal Chol 171, TG 40, HDL 77, LDL 86 Lipoprotein (a) 218 High  05/04/2021: Chol 163, TG 65, HDL 67, LDL 73  02/20/2021: Lipoprotein (a) 186.6 High  01/10/2021: Glucose 92, BUN/Cr 11/0.66. EGFR 105. Na/K 134/3.9. Rest of the CMP normal H/H 13/39. MCV 91. Platelets 223 HbA1C N/A Chol 180, TG 60, HDL 64, LDL 104 TSH N/A Creatinine/aldosterone ratio normal.   Renin activity 10 (<5.8)   Review of Systems  Cardiovascular:  Negative for chest pain, claudication, dyspnea on exertion, leg swelling, palpitations and syncope.         Vitals:   12/10/22 1024  BP: 118/79  Pulse: 62  Resp: 16  SpO2: 99%      Body mass index is 23.74 kg/m. Filed Weights   12/10/22 1024  Weight: 127 lb 9.6 oz (57.9 kg)      Objective:   Physical Exam Vitals and nursing note reviewed.  Constitutional:      General: She  is not in acute distress. Neck:     Vascular: No JVD.  Cardiovascular:     Rate and Rhythm: Normal rate and regular rhythm.     Pulses:          Dorsalis pedis pulses are 0 on the right side and 2+ on the left side.       Posterior tibial pulses are 2+ on the right side and 2+ on the left side.     Heart sounds: Normal heart sounds. No murmur heard. Pulmonary:     Effort: Pulmonary effort is normal.     Breath sounds: Normal breath sounds. No wheezing or rales.  Musculoskeletal:     Right lower leg: No edema.     Left lower leg: No edema.         Assessment & Recommendations:    47 year old female with hypertension, former smoker, elevated coronary calcium score, elevated lipoprotein (a), family history of early CAD  Coronary calcification/coronary artery disease: Coronary calcium score 22, 98th percentile (02/2021) No obstructive CAD on CCTA (02/2021) Elevated  lipoprotein (a) 186 Okay not to take Aspirin Recommend aggressive risk factor modification. Chol 134, TG 42, HDL 74, LDL 50 (11/2022) on Crestor to 10 mg, Zetia 10 mg daily.  Hypertension: Controlled. Recent leg edema. I do not see any cardiac reason for her to have leg edema. I suspect this could be due to venous insufficiency. Wear compression stockings.  Changing medications as follows Stop spironolactone 50 mg daily. Start back hydrochlorothiazide 25 mg daily. Hopefully can reuce torsemide as needed in future.  Continue losartan 100 mg daily.  Hyperlipidemia: As above  F/u in 4 weeks to reassess blood pressure and leg edema.     Elder Negus, MD Pager: [phone] Office: [phone]

## 2022-12-18 ENCOUNTER — Ambulatory Visit: Payer: 59 | Admitting: Pulmonary Disease

## 2023-01-04 ENCOUNTER — Other Ambulatory Visit: Payer: Self-pay | Admitting: Internal Medicine

## 2023-01-04 DIAGNOSIS — G43009 Migraine without aura, not intractable, without status migrainosus: Secondary | ICD-10-CM

## 2023-01-10 ENCOUNTER — Ambulatory Visit: Payer: 59 | Admitting: Cardiology

## 2023-02-03 ENCOUNTER — Other Ambulatory Visit: Payer: Self-pay | Admitting: Internal Medicine

## 2023-02-03 DIAGNOSIS — I10 Essential (primary) hypertension: Secondary | ICD-10-CM

## 2023-02-11 ENCOUNTER — Ambulatory Visit: Payer: 59 | Admitting: Cardiology

## 2023-02-19 ENCOUNTER — Ambulatory Visit: Payer: 59 | Admitting: Cardiology

## 2023-03-15 ENCOUNTER — Encounter: Payer: Self-pay | Admitting: Cardiology

## 2023-03-15 ENCOUNTER — Ambulatory Visit: Payer: 59 | Attending: Cardiology | Admitting: Cardiology

## 2023-03-15 VITALS — BP 115/75 | HR 63 | Resp 16 | Ht 62.0 in | Wt 121.0 lb

## 2023-03-15 DIAGNOSIS — E7841 Elevated Lipoprotein(a): Secondary | ICD-10-CM | POA: Diagnosis not present

## 2023-03-15 DIAGNOSIS — R931 Abnormal findings on diagnostic imaging of heart and coronary circulation: Secondary | ICD-10-CM

## 2023-03-15 DIAGNOSIS — E782 Mixed hyperlipidemia: Secondary | ICD-10-CM | POA: Diagnosis not present

## 2023-03-15 DIAGNOSIS — I10 Essential (primary) hypertension: Secondary | ICD-10-CM

## 2023-03-15 MED ORDER — ROSUVASTATIN CALCIUM 10 MG PO TABS: 10.0000 mg | ORAL_TABLET | Freq: Every day | ORAL | 3 refills | Status: DC

## 2023-03-15 MED ORDER — LOSARTAN POTASSIUM-HCTZ 50-12.5 MG PO TABS: 1.0000 | ORAL_TABLET | Freq: Every day | ORAL | 3 refills | Status: DC

## 2023-03-15 MED ORDER — EZETIMIBE 10 MG PO TABS: 10.0000 mg | ORAL_TABLET | Freq: Every day | ORAL | 3 refills | Status: DC

## 2023-03-15 MED ORDER — TORSEMIDE 20 MG PO TABS: 20.0000 mg | ORAL_TABLET | Freq: Every day | ORAL | 3 refills | Status: DC

## 2023-03-15 NOTE — Progress Notes (Signed)
Cardiology Office Note:  .   Date:  03/15/2023  ID:  Katrina Bartlett, DOB May 09, 1976, MRN [id number] PCP: Etta Grandchild, MD  [facility] Providers Cardiologist:  Truett Mainland, MD PCP: Etta Grandchild, MD  Chief Complaint  Patient presents with   Elevated coronary artery calcium score      History of Present Illness: .    Katrina Bartlett is a 47 y.o. female with hypertension, hyperlipidemia, elevated calcium score, elevated lipoprotein (a)  Hide of the, former smoker, family history of early CAD  Patient is doing.  She is running 1-2 miles daily without any complaints of chest pain or shortness of breath.  She has noticed lower blood pressure numbers around 100/60 mmHg, and wonders if he could back off on any of her blood pressure medications.  Vitals:   03/15/23 0821  BP: 115/75  Pulse: 63  Resp: 16  SpO2: 96%     ROS:  Review of Systems  Cardiovascular:  Negative for chest pain, dyspnea on exertion, leg swelling, palpitations and syncope.     Studies Reviewed: Marland Kitchen       EKG 03/15/2023: Sinus rhythm Normal ECG No previous ECGs available    Lipid panel 12/03/2022: Chol 134, TG 42, HDL 74, LDL 50   Physical Exam:   Physical Exam Vitals and nursing note reviewed.  Constitutional:      General: She is not in acute distress. Neck:     Vascular: No JVD.  Cardiovascular:     Rate and Rhythm: Normal rate and regular rhythm.     Heart sounds: Normal heart sounds. No murmur heard. Pulmonary:     Effort: Pulmonary effort is normal.     Breath sounds: Normal breath sounds. No wheezing or rales.      VISIT DIAGNOSES: No diagnosis found.   ASSESSMENT AND PLAN: .    Katrina Bartlett is a 47 y.o. female with hypertension, former smoker, elevated coronary calcium score, elevated lipoprotein (a), family history of early CAD   Coronary calcification/coronary artery disease: Coronary calcium score 22, 98th percentile (02/2021) No obstructive CAD on  CCTA (02/2021) Elevated lipoprotein (a) 186 Okay not to take Aspirin Recommend aggressive risk factor modification. Chol 134, TG 42, HDL 74, LDL 50 (11/2022) on Crestor to 10 mg, Zetia 10 mg daily. Continue the same combination at this time. She is not interested in participating lipoprotein a clinical trials at this time.   Hypertension: Controlled. Blood pressure has been running lower lately. We will change losartan 100 mg, and hydrochlorothiazide 25 mg to a combination pill losartan-hydrochlorothiazide 50-12.5 mg daily.  If blood pressure starts running higher around 140 mmHg, we will probably need to increase the dose back again. Her leg edema is well-controlled with torsemide 20 mg daily and when she would like to continue the same.      Hyperlipidemia: As above   Meds ordered this encounter  Medications   torsemide (DEMADEX) 20 MG tablet    Sig: Take 1 tablet (20 mg total) by mouth daily.    Dispense:  90 tablet    Refill:  3   rosuvastatin (CRESTOR) 10 MG tablet    Sig: Take 1 tablet (10 mg total) by mouth daily.    Dispense:  90 tablet    Refill:  3   ezetimibe (ZETIA) 10 MG tablet    Sig: Take 1 tablet (10 mg total) by mouth daily.    Dispense:  90 tablet    Refill:  3  losartan-hydrochlorothiazide (HYZAAR) 50-12.5 MG tablet    Sig: Take 1 tablet by mouth daily.    Dispense:  90 tablet    Refill:  3     F/u in 1 year  Signed, Elder Negus, MD

## 2023-03-15 NOTE — Patient Instructions (Signed)
Medication Instructions:   STOP TAKING LOSARTAN NOW  STOP TAKING hydrochlorothiazide NOW  STOP TAKING ASPIRIN NOW  START TAKING LOSARTAN-hydrochlorothiazide (HYZAAR) 50-12.5 MG DOSE--TAKE ONE TABLET BY MOUTH DAILY  *If you need a refill on your cardiac medications before your next appointment, please call your pharmacy*    Follow-Up: At [facility], you and your health needs are our priority.  As part of our continuing mission to provide you with exceptional heart care, we have created designated Provider Care Teams.  These Care Teams include your primary Cardiologist (physician) and Advanced Practice Providers (APPs -  Physician Assistants and Nurse Practitioners) who all work together to provide you with the care you need, when you need it.  We recommend signing up for the patient portal called "MyChart".  Sign up information is provided on this After Visit Summary.  MyChart is used to connect with patients for Virtual Visits (Telemedicine).  Patients are able to view lab/test results, encounter notes, upcoming appointments, etc.  Non-urgent messages can be sent to your provider as well.   To learn more about what you can do with MyChart, go to [email or web address].    Your next appointment:   1 year(s)  Provider:   DR. Rosemary Holms

## 2023-03-23 IMAGING — MG MM DIGITAL SCREENING BILAT W/ TOMO AND CAD
8 series · 9 of 24 positions shown · non-contrast
Comparison: Previous exam(s).

CLINICAL DATA: Screening.

EXAM:
DIGITAL SCREENING BILATERAL MAMMOGRAM WITH TOMOSYNTHESIS AND CAD
TECHNIQUE: Bilateral screening digital craniocaudal and mediolateral oblique
mammograms were obtained. Bilateral screening digital breast
tomosynthesis was performed. The images were evaluated with
computer-aided detection.

[R MLO synth-2D]
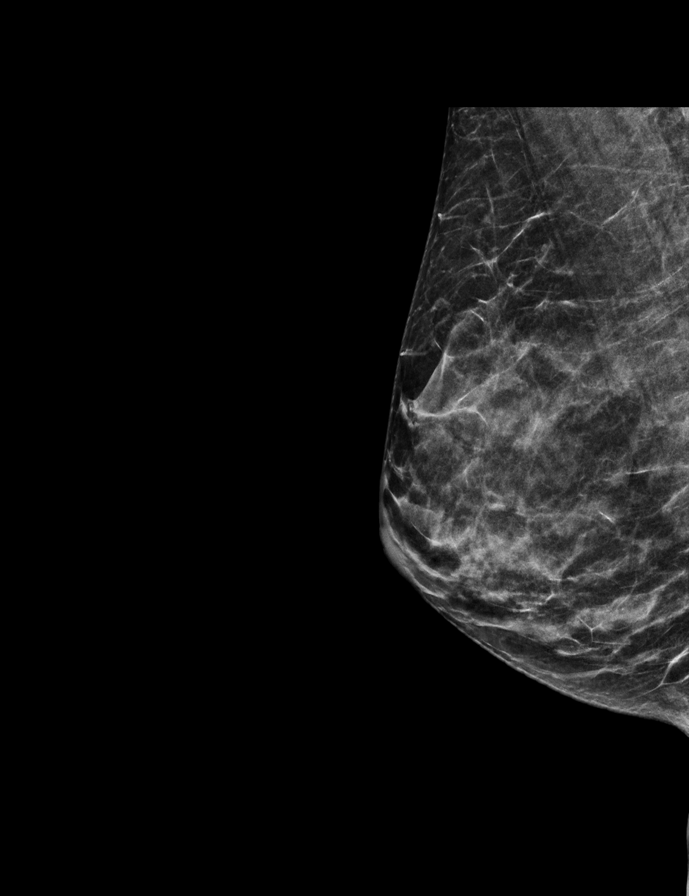

[L MLO synth-2D]
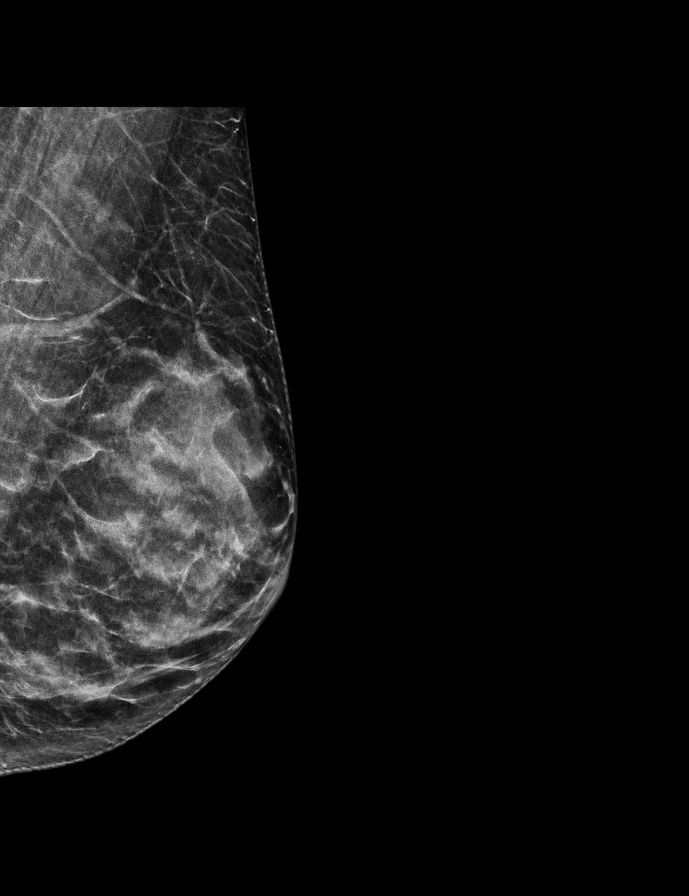

[R CC synth-2D]
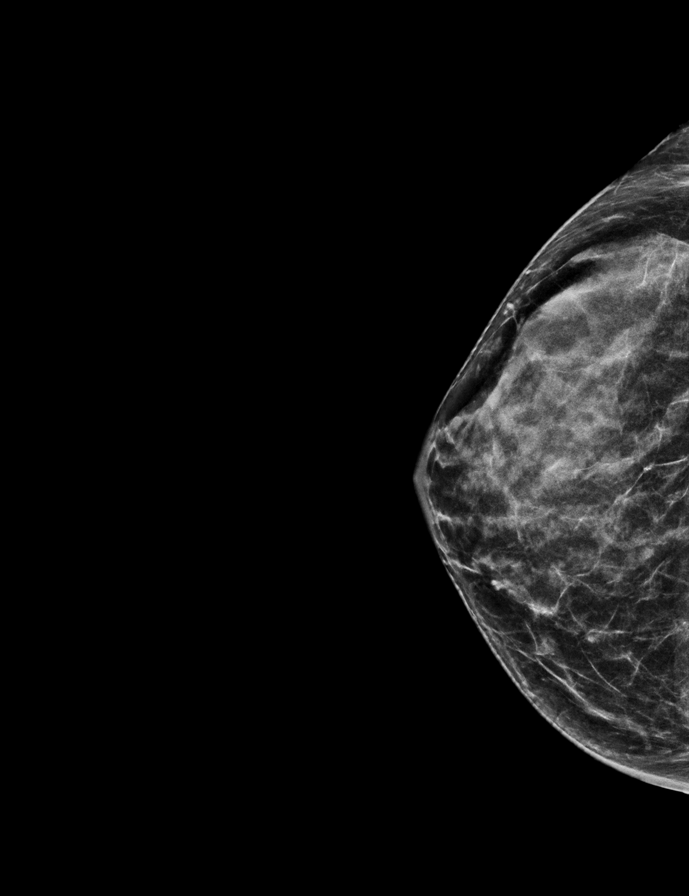

[L CC synth-2D]
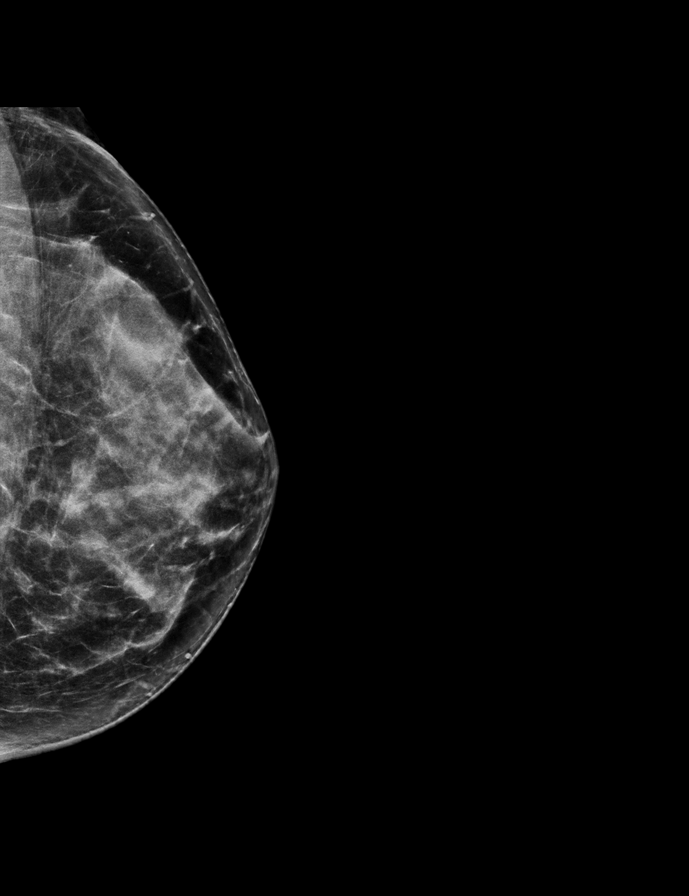

[R MLO tomo · 2 of 47 frames shown]
[frame 16/47]
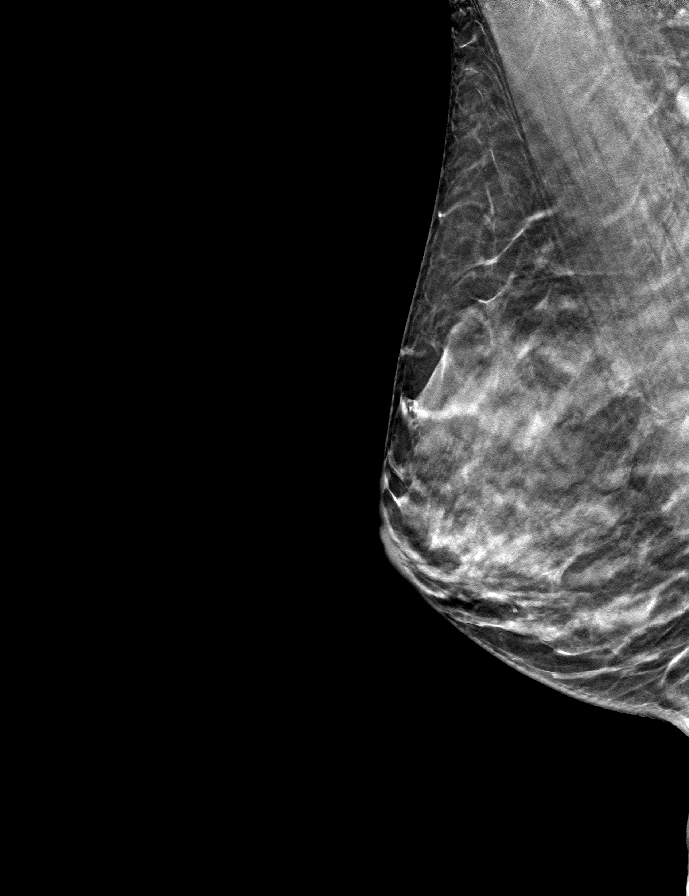
[frame 24/47]
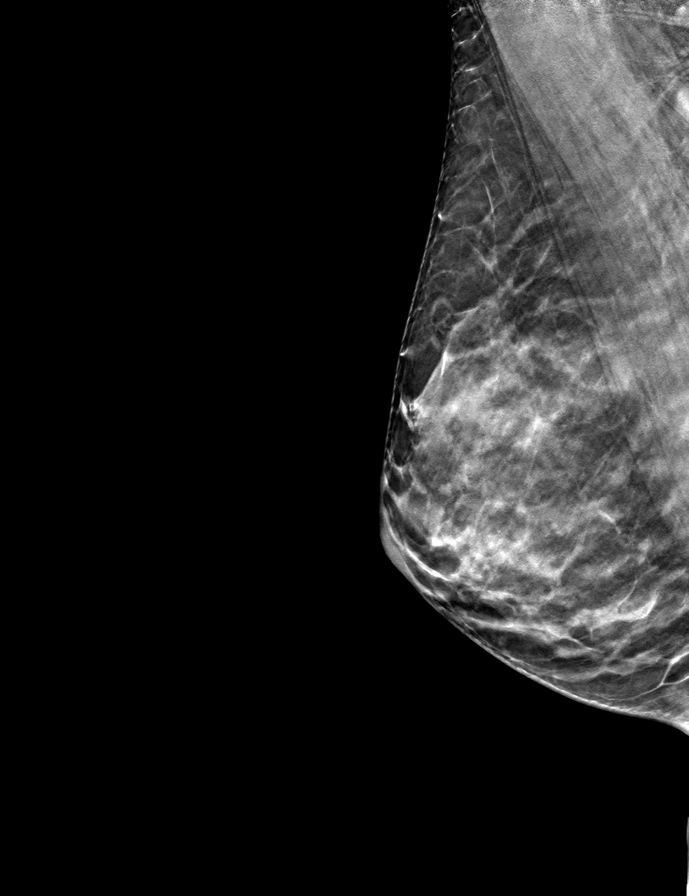

[L CC tomo · tomo slice 30/59.0]
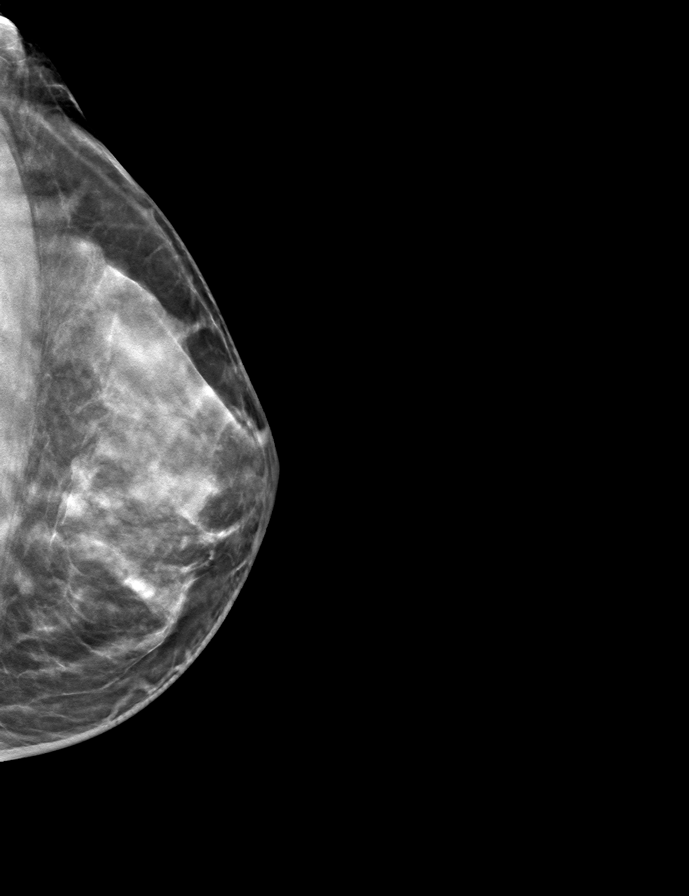

[R CC tomo · tomo slice 25/49.0]
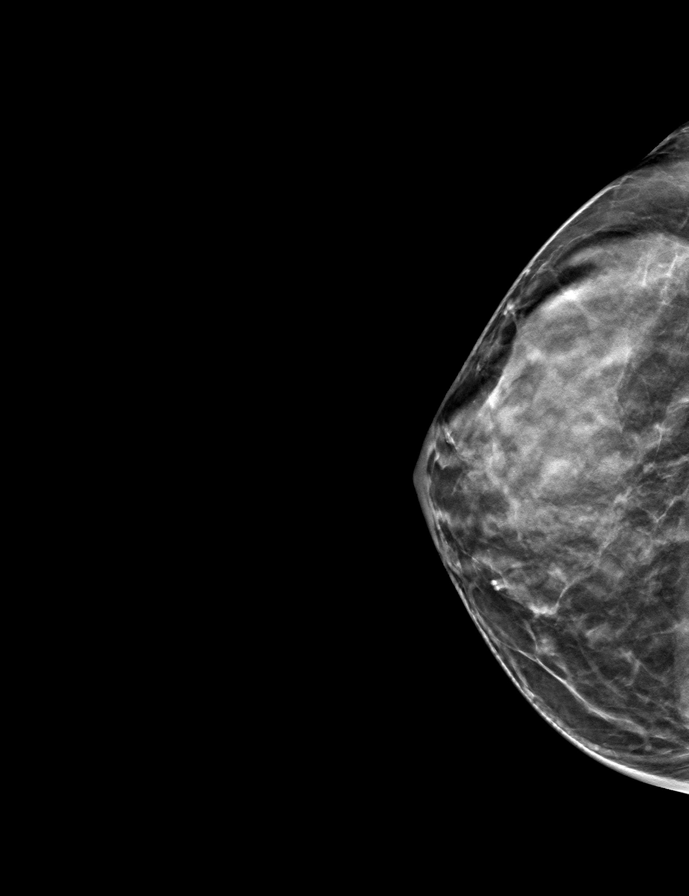

[L MLO tomo · tomo slice 25/50.0]
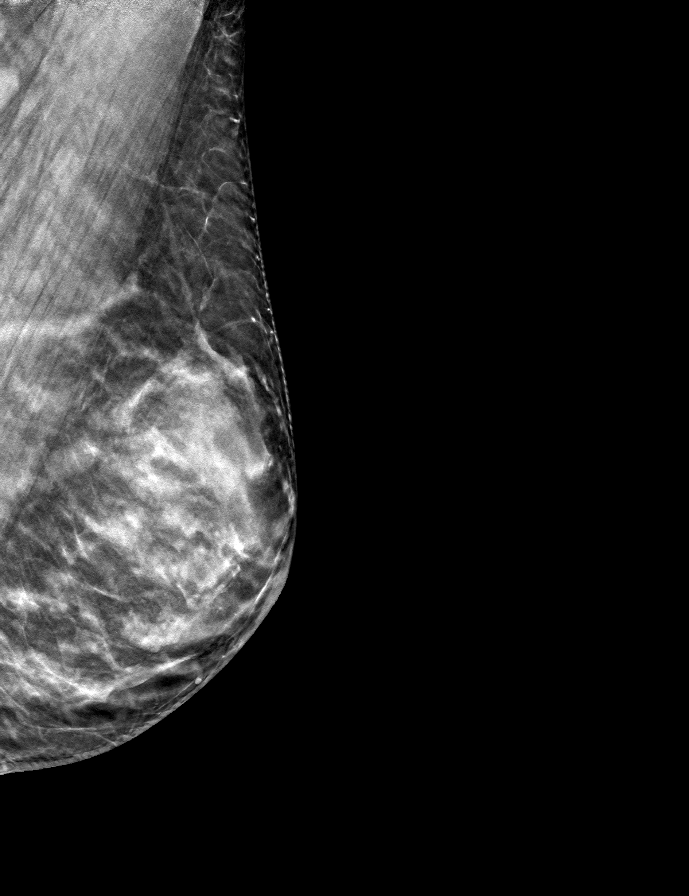

[9 of 24 positions shown; findings below may reference images not displayed]

ACR Breast Density Category c: The breast tissue is heterogeneously
dense, which may obscure small masses.
FINDINGS: There are no findings suspicious for malignancy.
IMPRESSION: No mammographic evidence of malignancy. A result letter of this
screening mammogram will be mailed directly to the patient.

RECOMMENDATION:
Screening mammogram in one year. (Code:Q3-W-BC3)

BI-RADS CATEGORY  1: Negative.

## 2023-03-29 ENCOUNTER — Other Ambulatory Visit: Payer: Self-pay | Admitting: Internal Medicine

## 2023-03-29 DIAGNOSIS — G43009 Migraine without aura, not intractable, without status migrainosus: Secondary | ICD-10-CM

## 2023-06-14 ENCOUNTER — Other Ambulatory Visit: Payer: Self-pay | Admitting: Internal Medicine

## 2023-06-14 DIAGNOSIS — G43009 Migraine without aura, not intractable, without status migrainosus: Secondary | ICD-10-CM

## 2023-06-22 ENCOUNTER — Telehealth: Payer: 59

## 2023-06-22 DIAGNOSIS — J101 Influenza due to other identified influenza virus with other respiratory manifestations: Secondary | ICD-10-CM

## 2023-06-22 MED ORDER — PROMETHAZINE-DM 6.25-15 MG/5ML PO SYRP: 5.0000 mL | ORAL_SOLUTION | Freq: Four times a day (QID) | ORAL | 0 refills | Status: AC | PRN

## 2023-06-22 NOTE — Patient Instructions (Signed)

## 2023-06-22 NOTE — Progress Notes (Signed)
Virtual Visit Consent   [name], you are scheduled for a virtual visit with a [facility] provider today. Just as with appointments in the office, your consent must be obtained to participate. Your consent will be active for this visit and any virtual visit you may have with one of our providers in the next 365 days. If you have a MyChart account, a copy of this consent can be sent to you electronically.  As this is a virtual visit, video technology does not allow for your provider to perform a traditional examination. This may limit your provider's ability to fully assess your condition. If your provider identifies any concerns that need to be evaluated in person or the need to arrange testing (such as labs, EKG, etc.), we will make arrangements to do so. Although advances in technology are sophisticated, we cannot ensure that it will always work on either your end or our end. If the connection with a video visit is poor, the visit may have to be switched to a telephone visit. With either a video or telephone visit, we are not always able to ensure that we have a secure connection.  By engaging in this virtual visit, you consent to the provision of healthcare and authorize for your insurance to be billed (if applicable) for the services provided during this visit. Depending on your insurance coverage, you may receive a charge related to this service.  I need to obtain your verbal consent now. Are you willing to proceed with your visit today? Mariacristina Aday has provided verbal consent on 06/22/2023 for a virtual visit (video or telephone). Georgana Curio, FNP  Date: 06/22/2023 9:20 AM  Virtual Visit via Video Note   I, Georgana Curio, connected with  Katrina Bartlett  ([id number], 1975/12/11) on 06/22/23 at  9:15 AM EST by a video-enabled telemedicine application and verified that I am speaking with the correct person using two identifiers.  Location: Patient: Virtual Visit Location Patient:  Home Provider: Virtual Visit Location Provider: Home Office   I discussed the limitations of evaluation and management by telemedicine and the availability of in person appointments. The patient expressed understanding and agreed to proceed.    History of Present Illness: Katrina Bartlett is a 48 y.o. who identifies as a female who was assigned female at birth, and is being seen today for influenza a, on ibuprofen and tylenol, fever gone now, bad cough- productive dark colored mucus. Marland Kitchen  HPI: HPI  Problems:  Patient Active Problem List   Diagnosis Date Noted   Neck pain, bilateral 07/23/2022   Visit for screening mammogram 07/23/2022   Foraminal stenosis of cervical region 07/23/2022   Multiple lung nodules on CT 01/20/2022   Hyperlipidemia LDL goal <70 08/02/2021   PAD (peripheral artery disease) (HCC) 08/02/2021   Elevated coronary artery calcium score 03/16/2021   Elevated lipoprotein(a) 03/16/2021   Claudication (HCC) 02/10/2021   Coronary artery disease of native artery of native heart with stable angina pectoris (HCC) 01/29/2021   Migraine without aura and without status migrainosus, not intractable 06/02/2020   Irritable bowel syndrome with constipation 03/28/2020   Routine general medical examination at a health care facility 03/28/2020   Mixed hyperlipidemia 03/28/2020   Essential hypertension 03/28/2020   Diuretic-induced hypokalemia 03/28/2020    Allergies:  Allergies  Allergen Reactions   Penicillins Hives   Medications:  Current Outpatient Medications:    Bioflavonoid Products (SUPER-C 1000 PO), Take by mouth., Disp: , Rfl:    ezetimibe (ZETIA) 10 MG  tablet, Take 1 tablet (10 mg total) by mouth daily., Disp: 90 tablet, Rfl: 3   losartan-hydrochlorothiazide (HYZAAR) 50-12.5 MG tablet, Take 1 tablet by mouth daily., Disp: 90 tablet, Rfl: 3   Multiple Vitamin (MULTIVITAMIN) capsule, Take 1 capsule by mouth daily., Disp: , Rfl:    omeprazole (PRILOSEC) 40 MG capsule,  Take 1 capsule (40 mg total) by mouth daily., Disp: 90 capsule, Rfl: 0   potassium chloride SA (KLOR-CON M) 20 MEQ tablet, TAKE 1 TABLET BY MOUTH TWICE DAILY, Disp: 180 tablet, Rfl: 1   Probiotic Product (PROBIOTIC-10 PO), Take by mouth., Disp: , Rfl:    rosuvastatin (CRESTOR) 10 MG tablet, Take 1 tablet (10 mg total) by mouth daily., Disp: 90 tablet, Rfl: 3   SUMAtriptan (IMITREX) 100 MG tablet, TAKE 1 TABLET BY MOUTH EVERY 2 HOURS AS NEEDED FOR MIGRAINE. MAX OF 2 TABLETS PER DAY, Disp: 10 tablet, Rfl: 2   torsemide (DEMADEX) 20 MG tablet, Take 1 tablet (20 mg total) by mouth daily., Disp: 90 tablet, Rfl: 3  Observations/Objective: Patient is well-developed, well-nourished in no acute distress.  Resting comfortably  at home.  Head is normocephalic, atraumatic.  No labored breathing.  Speech is clear and coherent with logical content.  Patient is alert and oriented at baseline.    Assessment and Plan: There are no diagnoses linked to this encounter. Increase fluids, humidifier at night, tylenol or ibuprofen as directed. Sx are improving, cough persists.   Follow Up Instructions: I discussed the assessment and treatment plan with the patient. The patient was provided an opportunity to ask questions and all were answered. The patient agreed with the plan and demonstrated an understanding of the instructions.  A copy of instructions were sent to the patient via MyChart unless otherwise noted below.     The patient was advised to call back or seek an in-person evaluation if the symptoms worsen or if the condition fails to improve as anticipated.    Georgana Curio, FNP

## 2023-07-02 ENCOUNTER — Other Ambulatory Visit: Payer: Self-pay | Admitting: Internal Medicine

## 2023-07-02 DIAGNOSIS — Z0000 Encounter for general adult medical examination without abnormal findings: Secondary | ICD-10-CM

## 2023-07-22 ENCOUNTER — Ambulatory Visit: Payer: 59 | Admitting: Internal Medicine

## 2023-07-22 ENCOUNTER — Encounter: Payer: Self-pay | Admitting: Internal Medicine

## 2023-07-22 VITALS — BP 130/76 | HR 64 | Temp 97.7°F | Resp 16 | Ht 62.0 in | Wt 125.0 lb

## 2023-07-22 DIAGNOSIS — E785 Hyperlipidemia, unspecified: Secondary | ICD-10-CM

## 2023-07-22 DIAGNOSIS — I10 Essential (primary) hypertension: Secondary | ICD-10-CM | POA: Diagnosis not present

## 2023-07-22 DIAGNOSIS — F411 Generalized anxiety disorder: Secondary | ICD-10-CM | POA: Diagnosis not present

## 2023-07-22 DIAGNOSIS — Z23 Encounter for immunization: Secondary | ICD-10-CM | POA: Diagnosis not present

## 2023-07-22 DIAGNOSIS — E876 Hypokalemia: Secondary | ICD-10-CM | POA: Diagnosis not present

## 2023-07-22 DIAGNOSIS — T502X5A Adverse effect of carbonic-anhydrase inhibitors, benzothiadiazides and other diuretics, initial encounter: Secondary | ICD-10-CM

## 2023-07-22 LAB — CBC WITH DIFFERENTIAL/PLATELET
Basophils Absolute: 0.1 10*3/uL (ref 0.0–0.1)
Basophils Relative: 1.1 % (ref 0.0–3.0)
Eosinophils Absolute: 0.2 10*3/uL (ref 0.0–0.7)
Eosinophils Relative: 2.8 % (ref 0.0–5.0)
HCT: 39.7 % (ref 36.0–46.0)
Hemoglobin: 13.8 g/dL (ref 12.0–15.0)
Lymphocytes Relative: 37 % (ref 12.0–46.0)
Lymphs Abs: 2.5 10*3/uL (ref 0.7–4.0)
MCHC: 34.7 g/dL (ref 30.0–36.0)
MCV: 91.3 fl (ref 78.0–100.0)
Monocytes Absolute: 0.5 10*3/uL (ref 0.1–1.0)
Monocytes Relative: 7.5 % (ref 3.0–12.0)
Neutro Abs: 3.5 10*3/uL (ref 1.4–7.7)
Neutrophils Relative %: 51.6 % (ref 43.0–77.0)
Platelets: 247 10*3/uL (ref 150.0–400.0)
RBC: 4.35 Mil/uL (ref 3.87–5.11)
RDW: 12.6 % (ref 11.5–15.5)
WBC: 6.7 10*3/uL (ref 4.0–10.5)

## 2023-07-22 LAB — LIPID PANEL
Cholesterol: 175 mg/dL (ref 0–200)
HDL: 86.4 mg/dL (ref >39.00)
LDL Cholesterol: 79 mg/dL (ref 0–99)
NonHDL: 88.5
Total CHOL/HDL Ratio: 2
Triglycerides: 46 mg/dL (ref 0.0–149.0)
VLDL: 9.2 mg/dL (ref 0.0–40.0)

## 2023-07-22 LAB — BASIC METABOLIC PANEL
BUN: 9 mg/dL (ref 6–23)
CO2: 30 meq/L (ref 19–32)
Calcium: 9.8 mg/dL (ref 8.4–10.5)
Chloride: 98 meq/L (ref 96–112)
Creatinine, Ser: 0.54 mg/dL (ref 0.40–1.20)
GFR: 109.2 mL/min (ref >60.00)
Glucose, Bld: 87 mg/dL (ref 70–99)
Potassium: 3.6 meq/L (ref 3.5–5.1)
Sodium: 138 meq/L (ref 135–145)

## 2023-07-22 LAB — HEPATIC FUNCTION PANEL
ALT: 13 U/L (ref 0–35)
AST: 21 U/L (ref 0–37)
Albumin: 4.6 g/dL (ref 3.5–5.2)
Alkaline Phosphatase: 46 U/L (ref 39–117)
Bilirubin, Direct: 0.1 mg/dL (ref 0.0–0.3)
Total Bilirubin: 0.7 mg/dL (ref 0.2–1.2)
Total Protein: 7.1 g/dL (ref 6.0–8.3)

## 2023-07-22 LAB — CK: Total CK: 160 U/L (ref 7–177)

## 2023-07-22 LAB — MAGNESIUM: Magnesium: 2 mg/dL (ref 1.5–2.5)

## 2023-07-22 MED ORDER — TRAZODONE HCL 50 MG PO TABS: 50.0000 mg | ORAL_TABLET | Freq: Every day | ORAL | 0 refills | Status: DC

## 2023-07-22 NOTE — Patient Instructions (Signed)
 Hypertension, Adult High blood pressure (hypertension) is when the force of blood pumping through the arteries is too strong. The arteries are the blood vessels that carry blood from the heart throughout the body. Hypertension forces the heart to work harder to pump blood and may cause arteries to become narrow or stiff. Untreated or uncontrolled hypertension can lead to a heart attack, heart failure, a stroke, kidney disease, and other problems. A blood pressure reading consists of a higher number over a lower number. Ideally, your blood pressure should be below 120/80. The first ("top") number is called the systolic pressure. It is a measure of the pressure in your arteries as your heart beats. The second ("bottom") number is called the diastolic pressure. It is a measure of the pressure in your arteries as the heart relaxes. What are the causes? The exact cause of this condition is not known. There are some conditions that result in high blood pressure. What increases the risk? Certain factors may make you more likely to develop high blood pressure. Some of these risk factors are under your control, including: Smoking. Not getting enough exercise or physical activity. Being overweight. Having too much fat, sugar, calories, or salt (sodium) in your diet. Drinking too much alcohol. Other risk factors include: Having a personal history of heart disease, diabetes, high cholesterol, or kidney disease. Stress. Having a family history of high blood pressure and high cholesterol. Having obstructive sleep apnea. Age. The risk increases with age. What are the signs or symptoms? High blood pressure may not cause symptoms. Very high blood pressure (hypertensive crisis) may cause: Headache. Fast or irregular heartbeats (palpitations). Shortness of breath. Nosebleed. Nausea and vomiting. Vision changes. Severe chest pain, dizziness, and seizures. How is this diagnosed? This condition is diagnosed by  measuring your blood pressure while you are seated, with your arm resting on a flat surface, your legs uncrossed, and your feet flat on the floor. The cuff of the blood pressure monitor will be placed directly against the skin of your upper arm at the level of your heart. Blood pressure should be measured at least twice using the same arm. Certain conditions can cause a difference in blood pressure between your right and left arms. If you have a high blood pressure reading during one visit or you have normal blood pressure with other risk factors, you may be asked to: Return on a different day to have your blood pressure checked again. Monitor your blood pressure at home for 1 week or longer. If you are diagnosed with hypertension, you may have other blood or imaging tests to help your health care provider understand your overall risk for other conditions. How is this treated? This condition is treated by making healthy lifestyle changes, such as eating healthy foods, exercising more, and reducing your alcohol intake. You may be referred for counseling on a healthy diet and physical activity. Your health care provider may prescribe medicine if lifestyle changes are not enough to get your blood pressure under control and if: Your systolic blood pressure is above 130. Your diastolic blood pressure is above 80. Your personal target blood pressure may vary depending on your medical conditions, your age, and other factors. Follow these instructions at home: Eating and drinking  Eat a diet that is high in fiber and potassium, and low in sodium, added sugar, and fat. An example of this eating plan is called the DASH diet. DASH stands for Dietary Approaches to Stop Hypertension. To eat this way: Eat  plenty of fresh fruits and vegetables. Try to fill one half of your plate at each meal with fruits and vegetables. Eat whole grains, such as whole-wheat pasta, brown rice, or whole-grain bread. Fill about one  fourth of your plate with whole grains. Eat or drink low-fat dairy products, such as skim milk or low-fat yogurt. Avoid fatty cuts of meat, processed or cured meats, and poultry with skin. Fill about one fourth of your plate with lean proteins, such as fish, chicken without skin, beans, eggs, or tofu. Avoid pre-made and processed foods. These tend to be higher in sodium, added sugar, and fat. Reduce your daily sodium intake. Many people with hypertension should eat less than 1,500 mg of sodium a day. Do not drink alcohol if: Your health care provider tells you not to drink. You are pregnant, may be pregnant, or are planning to become pregnant. If you drink alcohol: Limit how much you have to: 0-1 drink a day for women. 0-2 drinks a day for men. Know how much alcohol is in your drink. In the [place] one drink equals one 12 oz bottle of beer (355 mL), one 5 oz glass of wine (148 mL), or one 1 oz glass of hard liquor (44 mL). Lifestyle  Work with your health care provider to maintain a healthy body weight or to lose weight. Ask what an ideal weight is for you. Get at least 30 minutes of exercise that causes your heart to beat faster (aerobic exercise) most days of the week. Activities may include walking, swimming, or biking. Include exercise to strengthen your muscles (resistance exercise), such as Pilates or lifting weights, as part of your weekly exercise routine. Try to do these types of exercises for 30 minutes at least 3 days a week. Do not use any products that contain nicotine or tobacco. These products include cigarettes, chewing tobacco, and vaping devices, such as e-cigarettes. If you need help quitting, ask your health care provider. Monitor your blood pressure at home as told by your health care provider. Keep all follow-up visits. This is important. Medicines Take over-the-counter and prescription medicines only as told by your health care provider. Follow directions carefully. Blood  pressure medicines must be taken as prescribed. Do not skip doses of blood pressure medicine. Doing this puts you at risk for problems and can make the medicine less effective. Ask your health care provider about side effects or reactions to medicines that you should watch for. Contact a health care provider if you: Think you are having a reaction to a medicine you are taking. Have headaches that keep coming back (recurring). Feel dizzy. Have swelling in your ankles. Have trouble with your vision. Get help right away if you: Develop a severe headache or confusion. Have unusual weakness or numbness. Feel faint. Have severe pain in your chest or abdomen. Vomit repeatedly. Have trouble breathing. These symptoms may be an emergency. Get help right away. Call 911. Do not wait to see if the symptoms will go away. Do not drive yourself to the hospital. Summary Hypertension is when the force of blood pumping through your arteries is too strong. If this condition is not controlled, it may put you at risk for serious complications. Your personal target blood pressure may vary depending on your medical conditions, your age, and other factors. For most people, a normal blood pressure is less than 120/80. Hypertension is treated with lifestyle changes, medicines, or a combination of both. Lifestyle changes include losing weight, eating a healthy,  low-sodium diet, exercising more, and limiting alcohol. This information is not intended to replace advice given to you by your health care provider. Make sure you discuss any questions you have with your health care provider. Document Revised: 03/14/2021 Document Reviewed: 03/14/2021 Elsevier Patient Education  2024 [employer].

## 2023-07-22 NOTE — Progress Notes (Unsigned)
 Subjective:  Patient ID: Katrina Bartlett, female    DOB: 07/06/1975  Age: 48 y.o. MRN: [id number]  CC: Hypertension and Hyperlipidemia   HPI Katrina Bartlett presents for f/up ----  Discussed the use of AI scribe software for clinical note transcription with the patient, who gave verbal consent to proceed.  History of Present Illness   Katrina Bartlett "Katrina Bartlett" is a 48 year old female with hypertension and hyperlipidemia who presents for blood work recheck and management of anxiety and sleep disturbances.  She is undergoing ongoing management for hypertension and hyperlipidemia. Her cardiologist recently adjusted her medication regimen, and she is currently taking losartan HCTZ, Zetia, and rosuvastatin. Additionally, she takes sumatriptan for headaches and torsemide. She takes potassium once daily and prefers to minimize her medication intake.  She experiences anxiety and difficulty focusing, attributing these to stress following her father's death. She describes past panic attacks, particularly after her brother's suicide, but notes her current anxiety is less severe. She reports trouble sleeping, sometimes using a glass of wine to help fall asleep, which often results in waking up during the night. Her lack of focus and sleep disturbances are affecting her work, as she struggles to remember tasks and feels overwhelmed by her responsibilities.  No high blood pressure symptoms such as headache or blurred vision. No swelling in her legs or feet. No muscle or joint aches from her cholesterol medications. No potassium-related symptoms like cramping or twitching.       Outpatient Medications Prior to Visit  Medication Sig Dispense Refill   Bioflavonoid Products (SUPER-C 1000 PO) Take by mouth.     ezetimibe (ZETIA) 10 MG tablet Take 1 tablet (10 mg total) by mouth daily. 90 tablet 3   losartan-hydrochlorothiazide (HYZAAR) 50-12.5 MG tablet Take 1 tablet by mouth daily. 90 tablet 3   Multiple  Vitamin (MULTIVITAMIN) capsule Take 1 capsule by mouth daily.     omeprazole (PRILOSEC) 40 MG capsule Take 1 capsule (40 mg total) by mouth daily. 90 capsule 0   potassium chloride SA (KLOR-CON M) 20 MEQ tablet TAKE 1 TABLET BY MOUTH TWICE DAILY 180 tablet 1   Probiotic Product (PROBIOTIC-10 PO) Take by mouth.     rosuvastatin (CRESTOR) 10 MG tablet Take 1 tablet (10 mg total) by mouth daily. 90 tablet 3   SUMAtriptan (IMITREX) 100 MG tablet TAKE 1 TABLET BY MOUTH EVERY 2 HOURS AS NEEDED FOR MIGRAINE. MAX OF 2 TABLETS PER DAY 10 tablet 2   torsemide (DEMADEX) 20 MG tablet Take 1 tablet (20 mg total) by mouth daily. 90 tablet 3   No facility-administered medications prior to visit.    ROS Review of Systems  Objective:  BP 130/76 (BP Location: Left Arm, Patient Position: Sitting, Cuff Size: Small)   Pulse 64   Temp 97.7 F (36.5 C) (Oral)   Resp 16   Ht 5\' 2"  (1.575 m)   Wt 125 lb (56.7 kg)   SpO2 98%   BMI 22.86 kg/m   BP Readings from Last 3 Encounters:  07/22/23 130/76  03/15/23 115/75  12/10/22 118/79    Wt Readings from Last 3 Encounters:  07/22/23 125 lb (56.7 kg)  03/15/23 121 lb (54.9 kg)  12/10/22 127 lb 9.6 oz (57.9 kg)    Physical Exam  Lab Results  Component Value Date   WBC 6.7 07/22/2023   HGB 13.8 07/22/2023   HCT 39.7 07/22/2023   PLT 247.0 07/22/2023   GLUCOSE 87 07/22/2023   CHOL 175 07/22/2023  TRIG 46.0 07/22/2023   HDL 86.40 07/22/2023   LDLCALC 79 07/22/2023   ALT 13 07/22/2023   AST 21 07/22/2023   NA 138 07/22/2023   K 3.6 07/22/2023   CL 98 07/22/2023   CREATININE 0.54 07/22/2023   BUN 9 07/22/2023   CO2 30 07/22/2023   TSH 1.56 07/23/2022    MM 3D DIAGNOSTIC MAMMOGRAM UNILATERAL LEFT BREAST Result Date: 08/28/2022 CLINICAL DATA:  48 year old female presenting for evaluation of a left breast asymmetry. EXAM: DIGITAL DIAGNOSTIC UNILATERAL LEFT MAMMOGRAM WITH TOMOSYNTHESIS TECHNIQUE: Left digital diagnostic mammography and breast  tomosynthesis was performed. COMPARISON:  Previous exam(s). ACR Breast Density Category c: The breasts are heterogeneously dense, which may obscure small masses. FINDINGS: The asymmetry in the medial aspect of the left breast resolves on spot compression tomosynthesis imaging. No suspicious calcifications, masses or areas of distortion are seen in the left breast. IMPRESSION: Resolution of the left breast asymmetry consistent with overlapping fibroglandular tissue. RECOMMENDATION: Screening mammogram in one year.(Code:SM-B-01Y) I have discussed the findings and recommendations with the patient. If applicable, a reminder letter will be sent to the patient regarding the next appointment. BI-RADS CATEGORY  1: Negative. Electronically Signed   By: Frederico Hamman M.D.   On: 08/28/2022 12:02   Assessment & Plan:  Essential hypertension -     Basic metabolic panel; Future -     CBC with Differential/Platelet; Future -     Magnesium; Future  Diuretic-induced hypokalemia -     Basic metabolic panel; Future -     Magnesium; Future  Hyperlipidemia LDL goal <70 -     Lipid panel; Future -     Hepatic function panel; Future -     CK; Future  Immunization due -     Pneumococcal conjugate vaccine 20-valent  GAD (generalized anxiety disorder) -     traZODone HCl; Take 1 tablet (50 mg total) by mouth at bedtime.  Dispense: 90 tablet; Refill: 0     Follow-up: Return in about 6 months (around 01/22/2024).  Sanda Linger, MD

## 2023-07-23 ENCOUNTER — Encounter: Payer: Self-pay | Admitting: Internal Medicine

## 2023-08-16 ENCOUNTER — Ambulatory Visit
Admission: RE | Admit: 2023-08-16 | Discharge: 2023-08-16 | Disposition: A | Discharge status: Home / Self Care | Payer: 59 | Source: Ambulatory Visit | Attending: Internal Medicine | Admitting: Internal Medicine

## 2023-08-16 DIAGNOSIS — Z0000 Encounter for general adult medical examination without abnormal findings: Secondary | ICD-10-CM

## 2023-08-19 ENCOUNTER — Other Ambulatory Visit: Payer: Self-pay | Admitting: Internal Medicine

## 2023-08-19 DIAGNOSIS — I10 Essential (primary) hypertension: Secondary | ICD-10-CM

## 2023-08-19 DIAGNOSIS — E876 Hypokalemia: Secondary | ICD-10-CM

## 2023-10-30 ENCOUNTER — Other Ambulatory Visit: Payer: Self-pay | Admitting: Internal Medicine

## 2023-11-01 ENCOUNTER — Other Ambulatory Visit: Payer: Self-pay | Admitting: Internal Medicine

## 2023-11-01 DIAGNOSIS — G43009 Migraine without aura, not intractable, without status migrainosus: Secondary | ICD-10-CM

## 2024-01-21 ENCOUNTER — Other Ambulatory Visit: Payer: Self-pay | Admitting: Internal Medicine

## 2024-01-21 DIAGNOSIS — G43009 Migraine without aura, not intractable, without status migrainosus: Secondary | ICD-10-CM

## 2024-02-04 ENCOUNTER — Other Ambulatory Visit: Payer: Self-pay | Admitting: Medical Genetics

## 2024-02-11 ENCOUNTER — Encounter: Payer: Self-pay | Admitting: Internal Medicine

## 2024-02-11 ENCOUNTER — Ambulatory Visit: Admitting: Internal Medicine

## 2024-02-11 ENCOUNTER — Ambulatory Visit: Payer: Self-pay | Admitting: Internal Medicine

## 2024-02-11 VITALS — BP 112/72 | HR 63 | Temp 98.0°F | Resp 16 | Ht 62.0 in | Wt 129.2 lb

## 2024-02-11 DIAGNOSIS — T148XXA Other injury of unspecified body region, initial encounter: Secondary | ICD-10-CM

## 2024-02-11 DIAGNOSIS — Z23 Encounter for immunization: Secondary | ICD-10-CM

## 2024-02-11 DIAGNOSIS — Z0000 Encounter for general adult medical examination without abnormal findings: Secondary | ICD-10-CM

## 2024-02-11 DIAGNOSIS — E785 Hyperlipidemia, unspecified: Secondary | ICD-10-CM

## 2024-02-11 DIAGNOSIS — I10 Essential (primary) hypertension: Secondary | ICD-10-CM | POA: Diagnosis not present

## 2024-02-11 DIAGNOSIS — Z0001 Encounter for general adult medical examination with abnormal findings: Secondary | ICD-10-CM | POA: Insufficient documentation

## 2024-02-11 LAB — CBC WITH DIFFERENTIAL/PLATELET
Basophils Absolute: 0.1 K/uL (ref 0.0–0.1)
Basophils Relative: 0.9 % (ref 0.0–3.0)
Eosinophils Absolute: 0.2 K/uL (ref 0.0–0.7)
Eosinophils Relative: 2.4 % (ref 0.0–5.0)
HCT: 39.8 % (ref 36.0–46.0)
Hemoglobin: 14.1 g/dL (ref 12.0–15.0)
Lymphocytes Relative: 39.2 % (ref 12.0–46.0)
Lymphs Abs: 2.4 K/uL (ref 0.7–4.0)
MCHC: 35.4 g/dL (ref 30.0–36.0)
MCV: 90.7 fl (ref 78.0–100.0)
Monocytes Absolute: 0.5 K/uL (ref 0.1–1.0)
Monocytes Relative: 7.5 % (ref 3.0–12.0)
Neutro Abs: 3.1 K/uL (ref 1.4–7.7)
Neutrophils Relative %: 50 % (ref 43.0–77.0)
Platelets: 219 K/uL (ref 150.0–400.0)
RBC: 4.39 Mil/uL (ref 3.87–5.11)
RDW: 12 % (ref 11.5–15.5)
WBC: 6.2 K/uL (ref 4.0–10.5)

## 2024-02-11 LAB — HEPATIC FUNCTION PANEL
ALT: 13 U/L (ref 0–35)
AST: 18 U/L (ref 0–37)
Albumin: 4.7 g/dL (ref 3.5–5.2)
Alkaline Phosphatase: 37 U/L — ABNORMAL LOW (ref 39–117)
Bilirubin, Direct: 0.1 mg/dL (ref 0.0–0.3)
Total Bilirubin: 0.5 mg/dL (ref 0.2–1.2)
Total Protein: 6.7 g/dL (ref 6.0–8.3)

## 2024-02-11 LAB — BASIC METABOLIC PANEL WITH GFR
BUN: 11 mg/dL (ref 6–23)
CO2: 30 meq/L (ref 19–32)
Calcium: 9.6 mg/dL (ref 8.4–10.5)
Chloride: 100 meq/L (ref 96–112)
Creatinine, Ser: 0.53 mg/dL (ref 0.40–1.20)
GFR: 109.26 mL/min (ref >60.00)
Glucose, Bld: 90 mg/dL (ref 70–99)
Potassium: 3.7 meq/L (ref 3.5–5.1)
Sodium: 137 meq/L (ref 135–145)

## 2024-02-11 LAB — PROTIME-INR
INR: 1 ratio (ref 0.8–1.0)
Prothrombin Time: 10.6 s (ref 9.6–13.1)

## 2024-02-11 LAB — TSH: TSH: 1.33 u[IU]/mL (ref 0.35–5.50)

## 2024-02-11 LAB — APTT: aPTT: 26.7 s (ref 25.4–36.8)

## 2024-02-11 NOTE — Patient Instructions (Signed)
 You received your first Hepatitis B vaccine today. Please return for the second dose in one month.   Health Maintenance, Female Adopting a healthy lifestyle and getting preventive care are important in promoting health and wellness. Ask your health care provider about: The right schedule for you to have regular tests and exams. Things you can do on your own to prevent diseases and keep yourself healthy. What should I know about diet, weight, and exercise? Eat a healthy diet  Eat a diet that includes plenty of vegetables, fruits, low-fat dairy products, and lean protein. Do not eat a lot of foods that are high in solid fats, added sugars, or sodium. Maintain a healthy weight Body mass index (BMI) is used to identify weight problems. It estimates body fat based on height and weight. Your health care provider can help determine your BMI and help you achieve or maintain a healthy weight. Get regular exercise Get regular exercise. This is one of the most important things you can do for your health. Most adults should: Exercise for at least 150 minutes each week. The exercise should increase your heart rate and make you sweat (moderate-intensity exercise). Do strengthening exercises at least twice a week. This is in addition to the moderate-intensity exercise. Spend less time sitting. Even light physical activity can be beneficial. Watch cholesterol and blood lipids Have your blood tested for lipids and cholesterol at 48 years of age, then have this test every 5 years. Have your cholesterol levels checked more often if: Your lipid or cholesterol levels are high. You are older than 48 years of age. You are at high risk for heart disease. What should I know about cancer screening? Depending on your health history and family history, you may need to have cancer screening at various ages. This may include screening for: Breast cancer. Cervical cancer. Colorectal cancer. Skin cancer. Lung  cancer. What should I know about heart disease, diabetes, and high blood pressure? Blood pressure and heart disease High blood pressure causes heart disease and increases the risk of stroke. This is more likely to develop in people who have high blood pressure readings or are overweight. Have your blood pressure checked: Every 3-5 years if you are 88-54 years of age. Every year if you are 38 years old or older. Diabetes Have regular diabetes screenings. This checks your fasting blood sugar level. Have the screening done: Once every three years after age 23 if you are at a normal weight and have a low risk for diabetes. More often and at a younger age if you are overweight or have a high risk for diabetes. What should I know about preventing infection? Hepatitis B If you have a higher risk for hepatitis B, you should be screened for this virus. Talk with your health care provider to find out if you are at risk for hepatitis B infection. Hepatitis C Testing is recommended for: Everyone born from [birth year] through 1965. Anyone with known risk factors for hepatitis C. Sexually transmitted infections (STIs) Get screened for STIs, including gonorrhea and chlamydia, if: You are sexually active and are younger than 48 years of age. You are older than 48 years of age and your health care provider tells you that you are at risk for this type of infection. Your sexual activity has changed since you were last screened, and you are at increased risk for chlamydia or gonorrhea. Ask your health care provider if you are at risk. Ask your health care provider about whether you  are at high risk for HIV. Your health care provider may recommend a prescription medicine to help prevent HIV infection. If you choose to take medicine to prevent HIV, you should first get tested for HIV. You should then be tested every 3 months for as long as you are taking the medicine. Pregnancy If you are about to stop having your  period (premenopausal) and you may become pregnant, seek counseling before you get pregnant. Take 400 to 800 micrograms (mcg) of folic acid every day if you become pregnant. Ask for birth control (contraception) if you want to prevent pregnancy. Osteoporosis and menopause Osteoporosis is a disease in which the bones lose minerals and strength with aging. This can result in bone fractures. If you are 68 years old or older, or if you are at risk for osteoporosis and fractures, ask your health care provider if you should: Be screened for bone loss. Take a calcium or vitamin D supplement to lower your risk of fractures. Be given hormone replacement therapy (HRT) to treat symptoms of menopause. Follow these instructions at home: Alcohol use Do not drink alcohol if: Your health care provider tells you not to drink. You are pregnant, may be pregnant, or are planning to become pregnant. If you drink alcohol: Limit how much you have to: 0-1 drink a day. Know how much alcohol is in your drink. In the [place] one drink equals one 12 oz bottle of beer (355 mL), one 5 oz glass of wine (148 mL), or one 1 oz glass of hard liquor (44 mL). Lifestyle Do not use any products that contain nicotine or tobacco. These products include cigarettes, chewing tobacco, and vaping devices, such as e-cigarettes. If you need help quitting, ask your health care provider. Do not use street drugs. Do not share needles. Ask your health care provider for help if you need support or information about quitting drugs. General instructions Schedule regular health, dental, and eye exams. Stay current with your vaccines. Tell your health care provider if: You often feel depressed. You have ever been abused or do not feel safe at home. Summary Adopting a healthy lifestyle and getting preventive care are important in promoting health and wellness. Follow your health care provider's instructions about healthy diet, exercising, and  getting tested or screened for diseases. Follow your health care provider's instructions on monitoring your cholesterol and blood pressure. This information is not intended to replace advice given to you by your health care provider. Make sure you discuss any questions you have with your health care provider. Document Revised: 09/26/2020 Document Reviewed: 09/26/2020 Elsevier Patient Education  2024 [employer].

## 2024-02-11 NOTE — Progress Notes (Unsigned)
 Subjective:  Patient ID: Katrina Bartlett, female    DOB: Mar 22, 1976  Age: 48 y.o. MRN: [id number]  CC: Bruising  (Sudden bruising patient doesn't think she did anything to cause herself to bruise. But now the bruising is gone.), Annual Exam, and Hypertension   HPI Katrina Bartlett presents for a CPX and f/up ---  Discussed the use of AI scribe software for clinical note transcription with the patient, who gave verbal consent to proceed.  History of Present Illness Katrina Bartlett is a 48 year old female who presents with unexplained bruising and swelling.  About a month ago, she noticed bruising on her stomach without any known trauma, which resolved quickly without any bleeding, fever, chills, or night sweats. She occasionally takes ibuprofen. She also experienced simultaneous bruising on her arm and leg, but currently has no visible bruises.  She feels lightheaded at times but denies weakness or dizziness. She is currently taking losartan , hydrochlorothiazide , and torsemide . She experiences swelling in her legs, particularly at the end of the day, and in her hands upon waking, which prevents her from wearing her wedding ring.  She recalls a swollen lymph node that has since resolved and has not experienced any swollen lymph nodes or night sweats recently.  She has difficulty losing weight despite fasting and dietary changes, and has gained four pounds in the last six months. She has stopped drinking wine to manage her weight. She exercises regularly and tries to stay active despite sitting at a computer for work.  No bleeding, fever, chills, night sweats, joint swelling, rash, hematuria, epistaxis, or swollen lymph nodes. Occasional lightheadedness and swelling in her legs and hands.     Outpatient Medications Prior to Visit  Medication Sig Dispense Refill   Bioflavonoid Products (SUPER-C 1000 PO) Take by mouth.     ezetimibe  (ZETIA ) 10 MG tablet Take 1 tablet (10 mg total)  by mouth daily. 90 tablet 3   Multiple Vitamin (MULTIVITAMIN) capsule Take 1 capsule by mouth daily.     omeprazole  (PRILOSEC) 40 MG capsule Take 1 capsule (40 mg total) by mouth daily. 90 capsule 0   potassium chloride  SA (KLOR-CON  M) 20 MEQ tablet TAKE 1 TABLET BY MOUTH TWICE DAILY 180 tablet 1   Probiotic Product (PROBIOTIC-10 PO) Take by mouth.     rosuvastatin  (CRESTOR ) 10 MG tablet Take 1 tablet (10 mg total) by mouth daily. 90 tablet 3   SUMAtriptan  (IMITREX ) 100 MG tablet TAKE 1 TABLET BY MOUTH EVERY 2 HOURS AS NEEDED FOR MIGRAINE. MAX OF 2 TABLETS PER DAY 10 tablet 2   torsemide  (DEMADEX ) 20 MG tablet Take 1 tablet (20 mg total) by mouth daily. 90 tablet 3   losartan -hydrochlorothiazide  (HYZAAR) 50-12.5 MG tablet Take 1 tablet by mouth daily. 90 tablet 3   No facility-administered medications prior to visit.    ROS Review of Systems  Constitutional:  Positive for unexpected weight change (wt gain). Negative for appetite change, chills, diaphoresis, fatigue and fever.  HENT: Negative.    Eyes: Negative.   Respiratory: Negative.  Negative for cough, chest tightness, shortness of breath and wheezing.   Cardiovascular:  Negative for chest pain, palpitations and leg swelling.  Gastrointestinal: Negative.  Negative for abdominal pain, blood in stool, constipation, diarrhea, nausea and vomiting.  Genitourinary: Negative.  Negative for difficulty urinating, hematuria and vaginal bleeding.  Musculoskeletal: Negative.  Negative for arthralgias, back pain, myalgias and neck pain.  Skin: Negative.   Neurological:  Positive for light-headedness. Negative for dizziness  and weakness.  Hematological:  Negative for adenopathy. Bruises/bleeds easily.  Psychiatric/Behavioral: Negative.      Objective:  BP 112/72 (BP Location: Left Arm, Patient Position: Sitting, Cuff Size: Normal)   Pulse 63   Temp 98 F (36.7 C) (Oral)   Resp 16   Ht 5' 2 (1.575 m)   Wt 129 lb 3.2 oz (58.6 kg)   SpO2 98%    BMI 23.63 kg/m   BP Readings from Last 3 Encounters:  02/11/24 112/72  07/22/23 130/76  03/15/23 115/75    Wt Readings from Last 3 Encounters:  02/11/24 129 lb 3.2 oz (58.6 kg)  07/22/23 125 lb (56.7 kg)  03/15/23 121 lb (54.9 kg)    Physical Exam Vitals reviewed.  Constitutional:      Appearance: Normal appearance.  HENT:     Nose: Nose normal.     Mouth/Throat:     Mouth: Mucous membranes are moist.  Eyes:     General: No scleral icterus.    Conjunctiva/sclera: Conjunctivae normal.  Cardiovascular:     Rate and Rhythm: Normal rate and regular rhythm.     Heart sounds: No murmur heard.    No friction rub. No gallop.  Pulmonary:     Effort: Pulmonary effort is normal.     Breath sounds: No stridor. No wheezing, rhonchi or rales.  Abdominal:     General: Abdomen is flat.     Palpations: There is no mass.     Tenderness: There is no abdominal tenderness. There is no guarding.     Hernia: No hernia is present.  Musculoskeletal:        General: No deformity. Normal range of motion.     Cervical back: Neck supple.     Right lower leg: No edema.     Left lower leg: No edema.  Lymphadenopathy:     Cervical: No cervical adenopathy.  Skin:    Findings: No bruising, ecchymosis, erythema, petechiae or rash.  Neurological:     General: No focal deficit present.     Mental Status: She is alert. Mental status is at baseline.  Psychiatric:        Mood and Affect: Mood normal.        Behavior: Behavior normal.     Lab Results  Component Value Date   WBC 6.7 07/22/2023   HGB 13.8 07/22/2023   HCT 39.7 07/22/2023   PLT 247.0 07/22/2023   GLUCOSE 87 07/22/2023   CHOL 175 07/22/2023   TRIG 46.0 07/22/2023   HDL 86.40 07/22/2023   LDLCALC 79 07/22/2023   ALT 13 07/22/2023   AST 21 07/22/2023   NA 138 07/22/2023   K 3.6 07/22/2023   CL 98 07/22/2023   CREATININE 0.54 07/22/2023   BUN 9 07/22/2023   CO2 30 07/22/2023   TSH 1.56 07/23/2022    MM 3D SCREENING  MAMMOGRAM BILATERAL BREAST Result Date: 08/19/2023 CLINICAL DATA:  Screening. EXAM: DIGITAL SCREENING BILATERAL MAMMOGRAM WITH TOMOSYNTHESIS AND CAD TECHNIQUE: Bilateral screening digital craniocaudal and mediolateral oblique mammograms were obtained. Bilateral screening digital breast tomosynthesis was performed. The images were evaluated with computer-aided detection. COMPARISON:  Previous exam(s). ACR Breast Density Category c: The breasts are heterogeneously dense, which may obscure small masses. FINDINGS: There are no findings suspicious for malignancy. IMPRESSION: No mammographic evidence of malignancy. A result letter of this screening mammogram will be mailed directly to the patient. RECOMMENDATION: Screening mammogram in one year. (Code:SM-B-01Y) BI-RADS CATEGORY  1: Negative. Electronically Signed  By: Toribio Agreste M.D.   On: 08/19/2023 16:26    Assessment & Plan:  Essential hypertension -     Basic metabolic panel with GFR; Future -     CBC with Differential/Platelet; Future -     TSH; Future -     Hepatic function panel; Future  Need for immunization against influenza -     Flu vaccine trivalent PF, 6mos and older(Flulaval,Afluria,Fluarix,Fluzone)  Encounter for general adult medical examination with abnormal findings  Hyperlipidemia LDL goal <70 -     Hepatic function panel; Future  Bruising -     Hepatic function panel; Future -     Protime-INR; Future -     APTT; Future  Immunization due -     Heplisav-B  (HepB-CPG) Vaccine     Follow-up: No follow-ups on file.  Debby Molt, MD

## 2024-02-17 ENCOUNTER — Other Ambulatory Visit

## 2024-02-17 DIAGNOSIS — Z006 Encounter for examination for normal comparison and control in clinical research program: Secondary | ICD-10-CM

## 2024-02-17 DIAGNOSIS — Z0000 Encounter for general adult medical examination without abnormal findings: Secondary | ICD-10-CM

## 2024-02-25 LAB — GENECONNECT MOLECULAR SCREEN: Genetic Analysis Overall Interpretation: NEGATIVE

## 2024-03-12 ENCOUNTER — Ambulatory Visit

## 2024-03-16 ENCOUNTER — Ambulatory Visit (INDEPENDENT_AMBULATORY_CARE_PROVIDER_SITE_OTHER)

## 2024-03-16 DIAGNOSIS — Z23 Encounter for immunization: Secondary | ICD-10-CM | POA: Diagnosis not present

## 2024-03-16 NOTE — Progress Notes (Signed)
 Pt was given Hep B vaccine with no complications.

## 2024-04-09 ENCOUNTER — Other Ambulatory Visit (HOSPITAL_COMMUNITY): Payer: Self-pay

## 2024-04-09 ENCOUNTER — Encounter: Payer: Self-pay | Admitting: Cardiology

## 2024-04-09 ENCOUNTER — Ambulatory Visit: Attending: Cardiology | Admitting: Cardiology

## 2024-04-09 VITALS — BP 126/74 | HR 68 | Ht 62.0 in | Wt 135.0 lb

## 2024-04-09 DIAGNOSIS — R931 Abnormal findings on diagnostic imaging of heart and coronary circulation: Secondary | ICD-10-CM

## 2024-04-09 DIAGNOSIS — E7841 Elevated Lipoprotein(a): Secondary | ICD-10-CM | POA: Diagnosis not present

## 2024-04-09 DIAGNOSIS — E782 Mixed hyperlipidemia: Secondary | ICD-10-CM

## 2024-04-09 DIAGNOSIS — I10 Essential (primary) hypertension: Secondary | ICD-10-CM

## 2024-04-09 MED ORDER — ROSUVASTATIN CALCIUM 10 MG PO TABS
10.0000 mg | ORAL_TABLET | Freq: Every day | ORAL | 2 refills | Status: AC
  Filled 2024-04-09: qty 30, 30d supply, fill #0
  Filled 2024-05-04: qty 30, 30d supply, fill #1
  Filled 2024-06-03: qty 30, 30d supply, fill #2

## 2024-04-09 MED ORDER — ASPIRIN 81 MG PO TBEC
81.0000 mg | DELAYED_RELEASE_TABLET | Freq: Every day | ORAL | 3 refills | Status: AC
Start: 2024-04-09 — End: ?
  Filled 2024-04-09: qty 90, 90d supply, fill #0

## 2024-04-09 MED ORDER — EZETIMIBE 10 MG PO TABS
10.0000 mg | ORAL_TABLET | Freq: Every day | ORAL | 2 refills | Status: AC
  Filled 2024-04-09: qty 30, 30d supply, fill #0
  Filled 2024-05-04: qty 30, 30d supply, fill #1
  Filled 2024-06-03: qty 30, 30d supply, fill #2

## 2024-04-09 MED ORDER — TORSEMIDE 20 MG PO TABS
20.0000 mg | ORAL_TABLET | Freq: Every day | ORAL | 3 refills | Status: AC
  Filled 2024-04-09: qty 30, 30d supply, fill #0
  Filled 2024-05-04: qty 30, 30d supply, fill #1
  Filled 2024-06-03: qty 30, 30d supply, fill #2

## 2024-04-09 NOTE — Patient Instructions (Signed)
 Medication Instructions:  START aspirin  81 mg daily    *If you need a refill on your cardiac medications before your next appointment, please call your pharmacy*  Lab Work: Lipid panel   If you have labs (blood work) drawn today and your tests are completely normal, you will receive your results only by: MyChart Message (if you have MyChart) OR A paper copy in the mail If you have any lab test that is abnormal or we need to change your treatment, we will call you to review the results.  Follow-Up: At [facility], you and your health needs are our priority.  As part of our continuing mission to provide you with exceptional heart care, our providers are all part of one team.  This team includes your primary Cardiologist (physician) and Advanced Practice Providers or APPs (Physician Assistants and Nurse Practitioners) who all work together to provide you with the care you need, when you need it.  Your next appointment:   1 year(s)  Provider:   Newman JINNY Lawrence, MD

## 2024-04-09 NOTE — Progress Notes (Signed)
 Cardiology Office Note:  .   Date:  04/09/2024  ID:  Katrina Bartlett, DOB 10/18/1975, MRN [id number] PCP: Joshua Debby CROME, MD  [facility] Providers Cardiologist:  Newman Lawrence, MD PCP: Joshua Debby CROME, MD  Chief Complaint  Patient presents with   Hyperlipidemia      History of Present Illness: .    Katrina Bartlett is a 48 y.o. female with hypertension, hyperlipidemia, elevated calcium  score, elevated lipoprotein (a)  former smoker, family history of early CAD  Patient is doing well.  Although she has not been running, she exercises regularly without any chest pain or shortness of breath.  She is compliant with her medical therapy.  Reviewed and discussed lab results from earlier this year, details below.  She does report muscle aches and pains while on even low-dose of rosuvastatin .  Vitals:   04/09/24 1030  BP: 126/74  Pulse: 68  SpO2: 99%     ROS:  Review of Systems  Cardiovascular:  Negative for chest pain, dyspnea on exertion, leg swelling, palpitations and syncope.     Studies Reviewed: SABRA       EKG 04/09/2024: Normal sinus rhythm Normal ECG When compared with ECG of 15-Mar-2023 08:22, No significant change was found    Independently interpreted 3-01/2024: Chol 175, TG 46, HDL 86, LDL 79 Hb 14.1 Cr 0.5  12/03/2022: Chol 134, TG 42, HDL 74, LDL 50  2022: Lp(a) 186   Physical Exam:   Physical Exam Vitals and nursing note reviewed.  Constitutional:      General: She is not in acute distress. Neck:     Vascular: No JVD.  Cardiovascular:     Rate and Rhythm: Normal rate and regular rhythm.     Heart sounds: Normal heart sounds. No murmur heard. Pulmonary:     Effort: Pulmonary effort is normal.     Breath sounds: Normal breath sounds. No wheezing or rales.      VISIT DIAGNOSES:   ICD-10-CM   1. Mixed hyperlipidemia  E78.2 EKG 12-Lead    Lipid panel    2. Essential hypertension  I10 EKG 12-Lead    torsemide  (DEMADEX ) 20 MG  tablet    3. Elevated coronary artery calcium  score  R93.1 rosuvastatin  (CRESTOR ) 10 MG tablet    4. Elevated lipoprotein A level  E78.41 Lipid panel       ASSESSMENT AND PLAN: .    Katrina Bartlett is a 48 y.o. female with hypertension, former smoker, elevated coronary calcium  score, elevated lipoprotein (a), family history of early CAD   Coronary calcification/coronary artery disease: Coronary calcium  score 22, 98th percentile (02/2021) No obstructive CAD on CCTA (02/2021) Elevated lipoprotein (a) 186 Recommend aggressive risk factor modification. Would prefer LDL to be <55.  Check lipid panel today and lipoprotein a.  If not optimal on max tolerated Crestor  dose of 10 mg daily and Zetia  10 mg daily, could consider injectable agents. Finally, given her elevated lipoprotein a and overall cardiovascular risk profile, recommend aspirin  81 mg daily. Separately, I encouraged her to discuss with her.  Relatives regarding checking lipoprotein a.   Hypertension: Controlled without any other antihypertensive medications, except torsemide  that she takes daily.  Refill the same.   Hyperlipidemia: As above   Meds ordered this encounter  Medications   ezetimibe  (ZETIA ) 10 MG tablet    Sig: Take 1 tablet (10 mg total) by mouth daily.    Dispense:  60 tablet    Refill:  2   rosuvastatin  (CRESTOR )  10 MG tablet    Sig: Take 1 tablet (10 mg total) by mouth daily.    Dispense:  60 tablet    Refill:  2   torsemide  (DEMADEX ) 20 MG tablet    Sig: Take 1 tablet (20 mg total) by mouth daily.    Dispense:  90 tablet    Refill:  3   aspirin  EC 81 MG tablet    Sig: Take 1 tablet (81 mg total) by mouth daily. Swallow whole.    Dispense:  90 tablet    Refill:  3     F/u in 1 year  Signed, Newman JINNY Lawrence, MD

## 2024-04-10 ENCOUNTER — Ambulatory Visit: Payer: Self-pay | Admitting: Cardiology

## 2024-04-10 LAB — LIPID PANEL
Chol/HDL Ratio: 1.9 ratio (ref 0.0–4.4)
Cholesterol, Total: 164 mg/dL (ref 100–199)
HDL: 87 mg/dL (ref >39)
LDL Chol Calc (NIH): 68 mg/dL (ref 0–99)
Triglycerides: 37 mg/dL (ref 0–149)
VLDL Cholesterol Cal: 9 mg/dL (ref 5–40)

## 2024-04-10 NOTE — Progress Notes (Signed)
 LDL 68 on Crestor  10 mg daily. If symptoms of myalgia are significant, we could consider changing to Repatha- especially in light of elevated Lp(a) and family history Refer to lipid clinic.  Thanks MJP

## 2024-04-14 NOTE — Progress Notes (Signed)
 MyChart message containing providers result note and interpretation read by patient:  Last read by Almarie Sheryn Hun at 5:35AM on 04/14/2024.

## 2024-04-15 ENCOUNTER — Other Ambulatory Visit: Payer: Self-pay

## 2024-04-15 DIAGNOSIS — I10 Essential (primary) hypertension: Secondary | ICD-10-CM

## 2024-04-24 ENCOUNTER — Other Ambulatory Visit: Payer: Self-pay | Admitting: Internal Medicine

## 2024-04-24 DIAGNOSIS — G43009 Migraine without aura, not intractable, without status migrainosus: Secondary | ICD-10-CM

## 2024-05-04 ENCOUNTER — Other Ambulatory Visit (HOSPITAL_COMMUNITY): Payer: Self-pay

## 2024-05-05 ENCOUNTER — Other Ambulatory Visit (HOSPITAL_COMMUNITY): Payer: Self-pay

## 2024-06-03 ENCOUNTER — Other Ambulatory Visit (HOSPITAL_COMMUNITY): Payer: Self-pay

## 2024-06-17 ENCOUNTER — Encounter: Payer: Self-pay | Admitting: Internal Medicine

## 2024-06-19 ENCOUNTER — Encounter: Payer: Self-pay | Admitting: Cardiology

## 2024-06-19 DIAGNOSIS — I25118 Atherosclerotic heart disease of native coronary artery with other forms of angina pectoris: Secondary | ICD-10-CM

## 2024-06-19 DIAGNOSIS — Z789 Other specified health status: Secondary | ICD-10-CM

## 2024-06-19 DIAGNOSIS — I10 Essential (primary) hypertension: Secondary | ICD-10-CM

## 2024-06-23 ENCOUNTER — Other Ambulatory Visit (HOSPITAL_COMMUNITY): Payer: Self-pay

## 2024-06-23 MED ORDER — LOSARTAN POTASSIUM 50 MG PO TABS
50.0000 mg | ORAL_TABLET | Freq: Every day | ORAL | 3 refills | Status: AC
  Filled 2024-06-23: qty 90, 90d supply, fill #0

## 2024-06-23 NOTE — Telephone Encounter (Signed)
 Okay to send losartan  50 mg daily, check BMP in 1 week to ensure kidneys are tolerating well. Please refer to lipid clinic given CAD and statin intolerance.  Regards, Dr. Elmira

## 2024-08-04 ENCOUNTER — Ambulatory Visit: Admitting: Pharmacist

## 2024-08-10 ENCOUNTER — Ambulatory Visit: Admitting: Internal Medicine
